# Patient Record
Sex: Male | Born: 1950
Health system: Southern US, Community
[De-identification: ages and names within clinical notes are randomized; demographics above are authoritative.]

## PROBLEM LIST (undated history)

## (undated) DIAGNOSIS — C6290 Malignant neoplasm of unspecified testis, unspecified whether descended or undescended: Secondary | ICD-10-CM

## (undated) DIAGNOSIS — N5235 Erectile dysfunction following radiation therapy: Secondary | ICD-10-CM

## (undated) DIAGNOSIS — N319 Neuromuscular dysfunction of bladder, unspecified: Secondary | ICD-10-CM

## (undated) DIAGNOSIS — K654 Sclerosing mesenteritis: Secondary | ICD-10-CM

## (undated) DIAGNOSIS — C801 Malignant (primary) neoplasm, unspecified: Secondary | ICD-10-CM

## (undated) DIAGNOSIS — K200 Eosinophilic esophagitis: Secondary | ICD-10-CM

## (undated) DIAGNOSIS — G723 Periodic paralysis: Secondary | ICD-10-CM

## (undated) DIAGNOSIS — C61 Malignant neoplasm of prostate: Secondary | ICD-10-CM

## (undated) DIAGNOSIS — G47419 Narcolepsy without cataplexy: Secondary | ICD-10-CM

## (undated) DIAGNOSIS — I10 Essential (primary) hypertension: Secondary | ICD-10-CM

## (undated) DIAGNOSIS — M87059 Idiopathic aseptic necrosis of unspecified femur: Secondary | ICD-10-CM

## (undated) DIAGNOSIS — H269 Unspecified cataract: Secondary | ICD-10-CM

## (undated) DIAGNOSIS — K529 Noninfective gastroenteritis and colitis, unspecified: Secondary | ICD-10-CM

## (undated) HISTORY — PX: HERNIA REPAIR: SHX51

## (undated) HISTORY — PX: TONSILLECTOMY: SUR1361

## (undated) HISTORY — PX: ORCHIECTOMY: SHX2116

## (undated) HISTORY — PX: LIPOMA EXCISION: SHX5283

## (undated) HISTORY — PX: RETINAL TEAR REPAIR CRYOTHERAPY: SHX5304

## (undated) HISTORY — DX: Sclerosing mesenteritis: K65.4

## (undated) HISTORY — PX: PROSTATECTOMY: SHX69

## (undated) HISTORY — PX: CATARACT EXTRACTION: SUR2

## (undated) HISTORY — PX: TOTAL HIP ARTHROPLASTY: SHX124

## (undated) HISTORY — DX: Malignant neoplasm of prostate: C61

## (undated) HISTORY — PX: ESOPHAGOGASTRODUODENOSCOPY: SHX1529

## (undated) HISTORY — DX: Idiopathic aseptic necrosis of unspecified femur: M87.059

## (undated) HISTORY — DX: Malignant neoplasm of unspecified testis, unspecified whether descended or undescended: C62.90

## (undated) HISTORY — PX: VASECTOMY: SHX75

## (undated) HISTORY — DX: Unspecified cataract: H26.9

## (undated) HISTORY — DX: Erectile dysfunction following radiation therapy: N52.35

---

## 2006-01-15 HISTORY — PX: PROSTATECTOMY: SHX69

## 2012-01-16 HISTORY — PX: COLONOSCOPY: SHX174

## 2012-01-16 HISTORY — PX: ORCHIECTOMY: SHX2116

## 2014-01-15 DIAGNOSIS — K200 Eosinophilic esophagitis: Secondary | ICD-10-CM

## 2014-01-15 HISTORY — DX: Eosinophilic esophagitis: K20.0

## 2014-01-15 HISTORY — PX: UPPER GASTROINTESTINAL ENDOSCOPY: SHX188

## 2016-01-16 HISTORY — PX: TOTAL HIP ARTHROPLASTY: SHX124

## 2017-01-15 HISTORY — PX: TOTAL HIP ARTHROPLASTY: SHX124

## 2019-02-03 DIAGNOSIS — G47411 Narcolepsy with cataplexy: Secondary | ICD-10-CM | POA: Diagnosis not present

## 2019-03-02 DIAGNOSIS — N319 Neuromuscular dysfunction of bladder, unspecified: Secondary | ICD-10-CM | POA: Diagnosis not present

## 2019-03-02 DIAGNOSIS — I10 Essential (primary) hypertension: Secondary | ICD-10-CM | POA: Diagnosis not present

## 2019-03-02 DIAGNOSIS — K219 Gastro-esophageal reflux disease without esophagitis: Secondary | ICD-10-CM | POA: Diagnosis not present

## 2019-03-02 DIAGNOSIS — G723 Periodic paralysis: Secondary | ICD-10-CM | POA: Diagnosis not present

## 2019-03-02 DIAGNOSIS — Z8546 Personal history of malignant neoplasm of prostate: Secondary | ICD-10-CM | POA: Diagnosis not present

## 2019-03-02 DIAGNOSIS — G459 Transient cerebral ischemic attack, unspecified: Secondary | ICD-10-CM | POA: Diagnosis not present

## 2019-03-02 DIAGNOSIS — E785 Hyperlipidemia, unspecified: Secondary | ICD-10-CM | POA: Diagnosis not present

## 2019-03-25 DIAGNOSIS — C61 Malignant neoplasm of prostate: Secondary | ICD-10-CM | POA: Diagnosis not present

## 2019-03-25 DIAGNOSIS — N319 Neuromuscular dysfunction of bladder, unspecified: Secondary | ICD-10-CM | POA: Diagnosis not present

## 2019-04-11 ENCOUNTER — Other Ambulatory Visit: Payer: Self-pay

## 2019-04-11 ENCOUNTER — Emergency Department (HOSPITAL_COMMUNITY)
Admission: EM | Admit: 2019-04-11 | Discharge: 2019-04-12 | Disposition: A | Discharge status: Home / Self Care | Payer: PPO | Attending: Emergency Medicine | Admitting: Emergency Medicine

## 2019-04-11 ENCOUNTER — Encounter (HOSPITAL_COMMUNITY): Payer: Self-pay | Admitting: Emergency Medicine

## 2019-04-11 DIAGNOSIS — Z8546 Personal history of malignant neoplasm of prostate: Secondary | ICD-10-CM | POA: Insufficient documentation

## 2019-04-11 DIAGNOSIS — I10 Essential (primary) hypertension: Secondary | ICD-10-CM | POA: Insufficient documentation

## 2019-04-11 DIAGNOSIS — Z79899 Other long term (current) drug therapy: Secondary | ICD-10-CM | POA: Insufficient documentation

## 2019-04-11 DIAGNOSIS — R1031 Right lower quadrant pain: Secondary | ICD-10-CM | POA: Diagnosis not present

## 2019-04-11 DIAGNOSIS — K5900 Constipation, unspecified: Secondary | ICD-10-CM | POA: Insufficient documentation

## 2019-04-11 HISTORY — DX: Narcolepsy without cataplexy: G47.419

## 2019-04-11 HISTORY — DX: Periodic paralysis: G72.3

## 2019-04-11 HISTORY — DX: Malignant (primary) neoplasm, unspecified: C80.1

## 2019-04-11 HISTORY — DX: Neuromuscular dysfunction of bladder, unspecified: N31.9

## 2019-04-11 HISTORY — DX: Noninfective gastroenteritis and colitis, unspecified: K52.9

## 2019-04-11 HISTORY — DX: Essential (primary) hypertension: I10

## 2019-04-11 HISTORY — DX: Eosinophilic esophagitis: K20.0

## 2019-04-11 LAB — URINALYSIS, ROUTINE W REFLEX MICROSCOPIC
Bilirubin Urine: NEGATIVE
Glucose, UA: NEGATIVE mg/dL
Hgb urine dipstick: NEGATIVE
Ketones, ur: NEGATIVE mg/dL
Leukocytes,Ua: NEGATIVE
Nitrite: NEGATIVE
Protein, ur: NEGATIVE mg/dL
Specific Gravity, Urine: 1.012 (ref 1.005–1.030)
pH: 5 (ref 5.0–8.0)

## 2019-04-11 LAB — COMPREHENSIVE METABOLIC PANEL
ALT: 29 U/L (ref 0–44)
AST: 21 U/L (ref 15–41)
Albumin: 4.6 g/dL (ref 3.5–5.0)
Alkaline Phosphatase: 86 U/L (ref 38–126)
Anion gap: 8 (ref 5–15)
BUN: 14 mg/dL (ref 8–23)
CO2: 22 mmol/L (ref 22–32)
Calcium: 9.3 mg/dL (ref 8.9–10.3)
Chloride: 110 mmol/L (ref 98–111)
Creatinine, Ser: 1.11 mg/dL (ref 0.61–1.24)
GFR calc Af Amer: >60 mL/min (ref >60)
GFR calc non Af Amer: >60 mL/min (ref >60)
Glucose, Bld: 104 mg/dL — ABNORMAL HIGH (ref 70–99)
Potassium: 4.1 mmol/L (ref 3.5–5.1)
Sodium: 140 mmol/L (ref 135–145)
Total Bilirubin: 0.8 mg/dL (ref 0.3–1.2)
Total Protein: 7.2 g/dL (ref 6.5–8.1)

## 2019-04-11 LAB — CBC
HCT: 45.6 % (ref 39.0–52.0)
Hemoglobin: 15.1 g/dL (ref 13.0–17.0)
MCH: 31 pg (ref 26.0–34.0)
MCHC: 33.1 g/dL (ref 30.0–36.0)
MCV: 93.6 fL (ref 80.0–100.0)
Platelets: 246 10*3/uL (ref 150–400)
RBC: 4.87 MIL/uL (ref 4.22–5.81)
RDW: 12.3 % (ref 11.5–15.5)
WBC: 7.1 10*3/uL (ref 4.0–10.5)
nRBC: 0 % (ref 0.0–0.2)

## 2019-04-11 LAB — LIPASE, BLOOD: Lipase: 53 U/L — ABNORMAL HIGH (ref 11–51)

## 2019-04-11 MED ORDER — SODIUM CHLORIDE 0.9 % IV SOLN: Freq: Once | INTRAVENOUS | Status: AC

## 2019-04-11 MED ORDER — SODIUM CHLORIDE 0.9% FLUSH: 3.0000 mL | Freq: Once | INTRAVENOUS | Status: DC

## 2019-04-11 NOTE — ED Triage Notes (Signed)
Pt c/o RLQ pain onset this afternoon, stabbing, +nausea, non radiating.

## 2019-04-11 NOTE — ED Provider Notes (Signed)
[facility] EMERGENCY DEPARTMENT Provider Note   CSN: [id number] Arrival date & time: 04/11/19  1957     History Chief Complaint  Patient presents with  . Abdominal Pain    Keith Walsh is a 69 y.o. male.  HPI Patient reports he got severe pain in his right lower quadrant this afternoon.  It started as a mild ache but then he got a severe episode that nearly doubled him over.  It did start to abate somewhat but he continued to have discomfort.  He became extremely nauseated when the episode occurred.  He has not seen any blood in the urine.  No pain or burning with urination.  No diarrhea or constipation.  No vomiting.  Patient has not had fever.  He reports off-and-on he has had a slight ache in the lower right quadrant but it has never gotten severe enough he felt he needed evaluation.  Pain did not radiate into the testicles or the leg.    Past Medical History:  Diagnosis Date  . Cancer [facility])    prostate  . Colitis   . Eosinophilic esophagitis   . Hyperkalemic periodic paralysis   . Hypertension   . Narcolepsy   . Neurogenic bladder disorder     There are no problems to display for this patient.   History reviewed. No pertinent surgical history.     No family history on file.  Social History   Tobacco Use  . Smoking status: Never Smoker  . Smokeless tobacco: Never Used  Substance Use Topics  . Alcohol use: Yes    Comment: occ  . Drug use: Never    Home Medications Prior to Admission medications   Medication Sig Start Date End Date Taking? Authorizing Provider  acetaZOLAMIDE (DIAMOX) 250 MG tablet Take 250 mg by mouth 4 (four) times daily. 02/16/19  Yes [provider]  amoxicillin (AMOXIL) 500 MG capsule Take 2,000 mg by mouth See admin instructions. Take 4 capsules (1000 mg) by mouth one hour prior to dental appointments   Yes [provider]  aspirin EC 81 MG tablet Take 81 mg by mouth daily with breakfast.   Yes  [provider]  losartan (COZAAR) 25 MG tablet Take 25 mg by mouth 2 (two) times daily with a meal. 02/16/19  Yes [provider]  MAGNESIUM PO Take 1 tablet by mouth at bedtime.   Yes [provider]  mirabegron ER (MYRBETRIQ) 25 MG TB24 tablet Take 25 mg by mouth daily with lunch.   Yes [provider]  modafinil (PROVIGIL) 200 MG tablet Take 200 mg by mouth 2 (two) times daily with breakfast and lunch.   Yes [provider]  pantoprazole (PROTONIX) 40 MG tablet Take 40 mg by mouth daily before breakfast. 02/16/19  Yes [provider]  Polyethyl Glycol-Propyl Glycol (SYSTANE ULTRA) 0.4-0.3 % SOLN Place 1 drop into both eyes 5 (five) times daily as needed (dry eyes).   Yes [provider]  Potassium 99 MG TABS Take 99 mg by mouth daily with breakfast.   Yes [provider]    Allergies    Proparacaine  Review of Systems   Review of Systems 10 Systems reviewed and are negative for acute change except as noted in the HPI.  Physical Exam Updated Vital Signs BP (!) 151/94   Pulse 69   Temp 98.2 F (36.8 C) (Oral)   Resp 14   SpO2 99%   Physical Exam Constitutional:  Appearance: Normal appearance.  HENT:     Head: Normocephalic and atraumatic.  Eyes:     Extraocular Movements: Extraocular movements intact.  Cardiovascular:     Rate and Rhythm: Normal rate and regular rhythm.     Pulses: Normal pulses.     Heart sounds: Normal heart sounds.  Pulmonary:     Effort: Pulmonary effort is normal.     Breath sounds: Normal breath sounds.  Abdominal:     Comments: Right lower quadrant tenderness no guarding.  Slight fullness in the inguinal region.  Upper abdomen nontender.  Genitourinary:    Comments: Bilateral testicles nontender.  No mass in the inguinal canal. Musculoskeletal:        General: No swelling or tenderness. Normal range of motion.     Right lower leg: No edema.     Left lower leg: No edema.   Skin:    General: Skin is warm and dry.  Neurological:     General: No focal deficit present.     Mental Status: He is alert and oriented to person, place, and time.     Coordination: Coordination normal.  Psychiatric:        Mood and Affect: Mood normal.     ED Results / Procedures / Treatments   Labs (all labs ordered are listed, but only abnormal results are displayed) Labs Reviewed  LIPASE, BLOOD - Abnormal; Notable for the following components:      Result Value   Lipase 53 (*)    All other components within normal limits  COMPREHENSIVE METABOLIC PANEL - Abnormal; Notable for the following components:   Glucose, Bld 104 (*)    All other components within normal limits  CBC  URINALYSIS, ROUTINE W REFLEX MICROSCOPIC    EKG None  Radiology No results found.  Procedures Procedures (including critical care time)  Medications Ordered in ED Medications  sodium chloride flush (NS) 0.9 % injection 3 mL (has no administration in time range)  0.9 %  sodium chloride infusion ( Intravenous New Bag/Given 04/11/19 2316)    ED Course  I have reviewed the triage vital signs and the nursing notes.  Pertinent labs & imaging results that were available during my care of the patient were reviewed by me and considered in my medical decision making (see chart for details).    MDM Rules/Calculators/A&P                      Patient at baseline is active and in good physical condition.  Intermittently he has been experience some right lower quadrant discomfort.  Today this became severe.  Pain includes the inguinal area.  On exam however there is no evident hernia.  We will proceed with CT scan to rule out appendicitis or hernia.  Anticipate if within normal limits, patient will be stable for discharge.  If no positive findings on CT, refer to general surgery for evaluation of suspected inguinal hernia. Final Clinical Impression(s) / ED Diagnoses Final diagnoses:  Right lower quadrant  abdominal pain    Rx / DC Orders ED Discharge Orders    None       Charlesetta Shanks, MD 04/11/19 2327

## 2019-04-12 ENCOUNTER — Emergency Department (HOSPITAL_COMMUNITY): Payer: PPO

## 2019-04-12 DIAGNOSIS — R1031 Right lower quadrant pain: Secondary | ICD-10-CM | POA: Diagnosis not present

## 2019-04-12 MED ORDER — IOHEXOL 300 MG/ML  SOLN
100.0000 mL | Freq: Once | INTRAMUSCULAR | Status: AC | PRN
  Administered 2019-04-12: 100 mL via INTRAVENOUS

## 2019-04-12 NOTE — Discharge Instructions (Signed)
Your CT scan showed no acute abnormality other than a large amount of stool in the right side of your large intestine.  This could be causing your pain.  You may alternate Tylenol 1000 mg every 6 hours as needed for pain and ibuprofen 600 mg every 8 hours as needed for pain.  I recommend that you increase your water and fiber intake. If you are not able to eat foods high in fiber, you may use Benefiber or Metamucil over-the-counter. I also recommend you use MiraLAX 1-2 times a day and Colace 100 mg twice a day to help with bowel movements. These medications are over the counter.  You may use other over-the-counter medications such as Dulcolax, Fleet enemas, magnesium citrate as needed for constipation. Please note that some of these medications may cause you to have abdominal cramping which is normal.   If you develop severe abdominal pain, fever (temperature of 100.4 or higher), persistent vomiting, distention of your abdomen, unable to have a bowel movement for 5 days or are not passing gas, please return to the hospital.

## 2019-04-12 NOTE — ED Provider Notes (Signed)
12:00 AM  Assumed care.  Patient is a 69 year old male who presented to the emergency department right lower quadrant abdominal pain.  Labs, urine unremarkable.  Plan is for CT of the abdomen pelvis for further evaluation.  1:05 AM  Pt's CT scan shows no acute abnormality.  Appendix appears normal.  He has diverticulosis without diverticulitis.  There is a moderate amount of stool in the right colon which could have caused his right-sided abdominal pain.  Will discharge with bowel regimen to help with constipation.  Recommended alternating Tylenol and ibuprofen for pain.  Recommended high-fiber diet.   At this time, I do not feel there is any life-threatening condition present. I have reviewed, interpreted and discussed all results (EKG, imaging, lab, urine as appropriate) and exam findings with patient/family. I have reviewed nursing notes and appropriate previous records.  I feel the patient is safe to be discharged home without further emergent workup and can continue workup as an outpatient as needed. Discussed usual and customary return precautions. Patient/family verbalize understanding and are comfortable with this plan.  Outpatient follow-up has been provided as needed. All questions have been answered.    Annie Roseboom, Delice Bison, DO 04/12/19 0109

## 2019-05-06 DIAGNOSIS — R7309 Other abnormal glucose: Secondary | ICD-10-CM | POA: Diagnosis not present

## 2019-05-06 DIAGNOSIS — N319 Neuromuscular dysfunction of bladder, unspecified: Secondary | ICD-10-CM | POA: Diagnosis not present

## 2019-05-06 DIAGNOSIS — I10 Essential (primary) hypertension: Secondary | ICD-10-CM | POA: Diagnosis not present

## 2019-05-06 DIAGNOSIS — G723 Periodic paralysis: Secondary | ICD-10-CM | POA: Diagnosis not present

## 2019-05-06 DIAGNOSIS — E785 Hyperlipidemia, unspecified: Secondary | ICD-10-CM | POA: Diagnosis not present

## 2019-05-06 DIAGNOSIS — G459 Transient cerebral ischemic attack, unspecified: Secondary | ICD-10-CM | POA: Diagnosis not present

## 2019-05-06 DIAGNOSIS — Z0000 Encounter for general adult medical examination without abnormal findings: Secondary | ICD-10-CM | POA: Diagnosis not present

## 2019-05-06 DIAGNOSIS — Z8546 Personal history of malignant neoplasm of prostate: Secondary | ICD-10-CM | POA: Diagnosis not present

## 2019-05-06 DIAGNOSIS — I700 Atherosclerosis of aorta: Secondary | ICD-10-CM | POA: Diagnosis not present

## 2019-05-06 DIAGNOSIS — I8393 Asymptomatic varicose veins of bilateral lower extremities: Secondary | ICD-10-CM | POA: Diagnosis not present

## 2019-05-06 DIAGNOSIS — Z1389 Encounter for screening for other disorder: Secondary | ICD-10-CM | POA: Diagnosis not present

## 2019-05-06 DIAGNOSIS — G47411 Narcolepsy with cataplexy: Secondary | ICD-10-CM | POA: Diagnosis not present

## 2019-05-26 ENCOUNTER — Other Ambulatory Visit: Payer: Self-pay | Admitting: *Deleted

## 2019-05-26 DIAGNOSIS — M79604 Pain in right leg: Secondary | ICD-10-CM

## 2019-05-26 DIAGNOSIS — M79605 Pain in left leg: Secondary | ICD-10-CM

## 2019-05-27 ENCOUNTER — Telehealth (HOSPITAL_COMMUNITY): Payer: Self-pay

## 2019-05-27 NOTE — Telephone Encounter (Signed)

## 2019-05-28 ENCOUNTER — Ambulatory Visit: Payer: PPO | Admitting: Physician Assistant

## 2019-05-28 ENCOUNTER — Other Ambulatory Visit: Payer: Self-pay

## 2019-05-28 ENCOUNTER — Encounter: Payer: Self-pay | Admitting: Vascular Surgery

## 2019-05-28 ENCOUNTER — Ambulatory Visit (HOSPITAL_COMMUNITY)
Admission: RE | Admit: 2019-05-28 | Discharge: 2019-05-28 | Disposition: A | Discharge status: Home / Self Care | Payer: PPO | Source: Ambulatory Visit | Attending: Surgery | Admitting: Surgery

## 2019-05-28 VITALS — BP 111/66 | HR 79 | Temp 97.9°F | Resp 18 | Ht 72.0 in | Wt 196.0 lb

## 2019-05-28 DIAGNOSIS — M79604 Pain in right leg: Secondary | ICD-10-CM

## 2019-05-28 DIAGNOSIS — M79605 Pain in left leg: Secondary | ICD-10-CM | POA: Diagnosis not present

## 2019-05-28 DIAGNOSIS — I872 Venous insufficiency (chronic) (peripheral): Secondary | ICD-10-CM

## 2019-05-28 NOTE — Progress Notes (Signed)
[facility] [place]   Reason for referral: Swollen left leg  History of Present Illness  Keith Walsh is a 69 y.o. male who presents with chief complaint: Left swelling with pain and buring leg.  Patient notes, onset of swelling > 5 years ago, associated with prolonged siting and standing.  The patient has had no history of DVT, positive history of varicose vein, no history of venous stasis ulcers, no history of  Lymphedema and positive history of skin changes in lower legs with erythema,and weeping.  There is a family history of venous disorders.  The patient has  used compression stockings in the past.  Past Medical History:  Diagnosis Date  . Cancer [facility])    prostate  . Colitis   . Eosinophilic esophagitis   . Hyperkalemic periodic paralysis   . Hypertension   . Narcolepsy   . Neurogenic bladder disorder     History reviewed. No pertinent surgical history.     Social History   Socioeconomic History  . Marital status: Widowed    Spouse name: Not on file  . Number of children: Not on file  . Years of education: Not on file  . Highest education level: Not on file  Occupational History  . Not on file  Tobacco Use  . Smoking status: Never Smoker  . Smokeless tobacco: Never Used  Substance and Sexual Activity  . Alcohol use: Yes    Comment: occ  . Drug use: Never  . Sexual activity: Not on file  Other Topics Concern  . Not on file  Social History Narrative  . Not on file   Social Determinants of Health   Financial Resource Strain:   . Difficulty of Paying Living Expenses:   Food Insecurity:   . Worried About Charity fundraiser in the Last Year:   . Arboriculturist in the Last Year:   Transportation Needs:   . Film/video editor (Medical):   Marland Kitchen Lack of Transportation (Non-Medical):   Physical Activity:   . Days of Exercise per Week:   . Minutes of Exercise per Session:   Stress:   . Feeling of Stress :   Social Connections:   .  Frequency of Communication with Friends and Family:   . Frequency of Social Gatherings with Friends and Family:   . Attends Religious Services:   . Active Member of Clubs or Organizations:   . Attends Archivist Meetings:   Marland Kitchen Marital Status:   Intimate Partner Violence:   . Fear of Current or Ex-Partner:   . Emotionally Abused:   Marland Kitchen Physically Abused:   . Sexually Abused:     Family History  Problem Relation Age of Onset  . Pernicious anemia Mother   . Cancer Mother   . Arthritis/Rheumatoid Mother   . Varicose Veins Father   . Heart disease Father   . Cancer Father     Current Outpatient Medications on File Prior to Visit  Medication Sig Dispense Refill  . acetaZOLAMIDE (DIAMOX) 250 MG tablet Take 250 mg by mouth 4 (four) times daily.    Marland Kitchen amoxicillin (AMOXIL) 500 MG capsule Take 2,000 mg by mouth See admin instructions. Take 4 capsules (1000 mg) by mouth one hour prior to dental appointments    . aspirin EC 81 MG tablet Take 81 mg by mouth daily with breakfast.    . losartan (COZAAR) 25 MG tablet Take 25 mg by mouth 2 (two) times daily  with a meal.    . MAGNESIUM PO Take 1 tablet by mouth at bedtime.    . mirabegron ER (MYRBETRIQ) 25 MG TB24 tablet Take 25 mg by mouth daily with lunch.    . modafinil (PROVIGIL) 200 MG tablet Take 200 mg by mouth 2 (two) times daily with breakfast and lunch.    . pantoprazole (PROTONIX) 40 MG tablet Take 40 mg by mouth daily before breakfast.    . Polyethyl Glycol-Propyl Glycol (SYSTANE ULTRA) 0.4-0.3 % SOLN Place 1 drop into both eyes 5 (five) times daily as needed (dry eyes).    . Potassium 99 MG TABS Take 99 mg by mouth daily with breakfast.     No current facility-administered medications on file prior to visit.    Allergies as of 05/28/2019 - Review Complete 04/11/2019  Allergen Reaction Noted  . Proparacaine Other (See Comments) 04/11/2019     ROS:   General:  No weight loss, Fever, chills  HEENT: No recent headaches,  no nasal bleeding, no visual changes, no sore throat  Neurologic: No dizziness, blackouts, seizures. No recent symptoms of stroke or mini- stroke. No recent episodes of slurred speech, or temporary blindness. [x]  history of MS type of neurologic dystrophy   Cardiac: No recent episodes of chest pain/pressure, no shortness of breath at rest.  No shortness of breath with exertion.  Denies history of atrial fibrillation or irregular heartbeat  Vascular: No history of rest pain in feet.  No history of claudication.  No history of non-healing ulcer, No history of DVT   Pulmonary: No home oxygen, no productive cough, no hemoptysis,  No asthma or wheezing  Musculoskeletal:  [ ]  Arthritis, [ ]  Low back pain,  [ ]  Joint pain  Hematologic:No history of hypercoagulable state.  No history of easy bleeding.  No history of anemia  Gastrointestinal: No hematochezia or melena,  No gastroesophageal reflux, no trouble swallowing  Urinary: [ ]  chronic Kidney disease, [ ]  on HD - [ ]  MWF or [ ]  TTHS, [ ]  Burning with urination, [ ]  Frequent urination, [ ]  Difficulty urinating;   Skin: No rashes  Psychological: No history of anxiety,  No history of depression  Physical Examination  Vitals:   05/28/19 1423  Resp: 18  Weight: 196 lb (88.9 kg)  Height: 6' (1.829 m)    Body mass index is 26.58 kg/m.  General:  Alert and oriented, no acute distress HEENT: Normal Neck: No bruit or JVD Pulmonary: Clear to auscultation bilaterally Cardiac: Regular Rate and Rhythm without murmur Abdomen: Soft, non-tender, non-distended, no mass, no scars Skin: No rash, prominent vericose vein medial and anterior left LE      Extremity Pulses:  2+ radial, brachial, femoral, dorsalis pedis, posterior tibial pulses bilaterally Musculoskeletal: No deformity or edema  Neurologic: Upper and lower extremity motor 5/5 and symmetric  DATA: +--------------+---------+------+-----------+------------+--------+  LEFT      Reflux NoRefluxReflux TimeDiameter cmsComments               Yes                   +--------------+---------+------+-----------+------------+--------+  CFV            yes  >1 second             +--------------+---------+------+-----------+------------+--------+  FV mid          yes  >1 second             +--------------+---------+------+-----------+------------+--------+  Popliteal  yes  >1 second             +--------------+---------+------+-----------+------------+--------+  GSV at [facility]        yes  >500 ms   0.910        +--------------+---------+------+-----------+------------+--------+  GSV prox thigh      yes  >500 ms   0.709        +--------------+---------+------+-----------+------------+--------+  GSV mid thigh       yes  >500 ms   0.582        +--------------+---------+------+-----------+------------+--------+  GSV dist thigh      yes  >500 ms   0.608        +--------------+---------+------+-----------+------------+--------+  GSV at knee        yes  >500 ms   0.578        +--------------+---------+------+-----------+------------+--------+  GSV prox calf       yes  >500 ms   1.17        +--------------+---------+------+-----------+------------+--------+  GSV mid calf       yes  >500 ms   0.635        +--------------+---------+------+-----------+------------+--------+  GSV dist calf       yes  >500 ms   0.604        +--------------+---------+------+-----------+------------+--------+  SSV Pop Fossa no               0.150        +--------------+---------+------+-----------+------------+--------+  SSV prox calf no               0.154          +--------------+---------+------+-----------+------------+--------+  SSV mid calf no               0.119        +--------------+---------+------+-----------+------------+--------+     Summary:  Left:  - No evidence of deep vein thrombosis seen in the left lower extremity,  from the common femoral through the popliteal veins.  - No evidence of superficial venous thrombosis in the left lower  extremity.  - No evidence of superficial venous reflux seen in the left short  saphenous vein.  - Venous reflux is noted in the left common femoral vein.  - Venous reflux is noted in the left sapheno-femoral junction.  - Venous reflux is noted in the left greater saphenous vein in the thigh.  - Venous reflux is noted in the left greater saphenous vein in the calf.  - Venous reflux is noted in the left femoral vein.  - Venous reflux is noted in the left popliteal vein.    Assessment: Left LE reflux with varicose veins - Venous reflux is noted in the left common femoral vein.  - Venous reflux is noted in the left sapheno-femoral junction.  - Venous reflux is noted in the left greater saphenous vein in the thigh.  - Venous reflux is noted in the left greater saphenous vein in the calf.  - Venous reflux is noted in the left femoral vein.  - Venous reflux is noted in the left popliteal vein.  Symptomatic history of cellulitis with weeping Left medial calf.   Plan: Thigh high 20-30 mmhg compression , elevation when at rest, water therapy and continue daily exercise as tolerates.  He will f/u in our vein clinic for exam and planing of possible intervention.     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    Terrence Dupont  Gerri Lins PA-C [facility] [place] Office: [phone]  MD in clinic [place]

## 2019-08-05 DIAGNOSIS — G4700 Insomnia, unspecified: Secondary | ICD-10-CM | POA: Diagnosis not present

## 2019-08-05 DIAGNOSIS — G47411 Narcolepsy with cataplexy: Secondary | ICD-10-CM | POA: Diagnosis not present

## 2019-09-02 DIAGNOSIS — H8110 Benign paroxysmal vertigo, unspecified ear: Secondary | ICD-10-CM | POA: Diagnosis not present

## 2019-10-16 DIAGNOSIS — N319 Neuromuscular dysfunction of bladder, unspecified: Secondary | ICD-10-CM | POA: Diagnosis not present

## 2019-10-16 DIAGNOSIS — C61 Malignant neoplasm of prostate: Secondary | ICD-10-CM | POA: Diagnosis not present

## 2019-10-23 DIAGNOSIS — R311 Benign essential microscopic hematuria: Secondary | ICD-10-CM | POA: Diagnosis not present

## 2019-10-23 DIAGNOSIS — N3040 Irradiation cystitis without hematuria: Secondary | ICD-10-CM | POA: Diagnosis not present

## 2019-10-23 DIAGNOSIS — N319 Neuromuscular dysfunction of bladder, unspecified: Secondary | ICD-10-CM | POA: Diagnosis not present

## 2019-10-27 DIAGNOSIS — N3000 Acute cystitis without hematuria: Secondary | ICD-10-CM | POA: Diagnosis not present

## 2019-11-04 DIAGNOSIS — G47411 Narcolepsy with cataplexy: Secondary | ICD-10-CM | POA: Diagnosis not present

## 2019-11-04 DIAGNOSIS — G4700 Insomnia, unspecified: Secondary | ICD-10-CM | POA: Diagnosis not present

## 2019-11-11 DIAGNOSIS — I700 Atherosclerosis of aorta: Secondary | ICD-10-CM | POA: Diagnosis not present

## 2019-11-11 DIAGNOSIS — I10 Essential (primary) hypertension: Secondary | ICD-10-CM | POA: Diagnosis not present

## 2019-11-11 DIAGNOSIS — G723 Periodic paralysis: Secondary | ICD-10-CM | POA: Diagnosis not present

## 2019-11-11 DIAGNOSIS — G459 Transient cerebral ischemic attack, unspecified: Secondary | ICD-10-CM | POA: Diagnosis not present

## 2019-11-11 DIAGNOSIS — G47411 Narcolepsy with cataplexy: Secondary | ICD-10-CM | POA: Diagnosis not present

## 2019-11-11 DIAGNOSIS — Z8546 Personal history of malignant neoplasm of prostate: Secondary | ICD-10-CM | POA: Diagnosis not present

## 2019-11-11 DIAGNOSIS — N319 Neuromuscular dysfunction of bladder, unspecified: Secondary | ICD-10-CM | POA: Diagnosis not present

## 2019-11-11 DIAGNOSIS — K219 Gastro-esophageal reflux disease without esophagitis: Secondary | ICD-10-CM | POA: Diagnosis not present

## 2019-11-26 ENCOUNTER — Encounter: Payer: Self-pay | Admitting: Vascular Surgery

## 2019-11-26 ENCOUNTER — Ambulatory Visit: Payer: PPO | Admitting: Vascular Surgery

## 2019-11-26 ENCOUNTER — Other Ambulatory Visit: Payer: Self-pay

## 2019-11-26 VITALS — BP 109/66 | HR 70 | Temp 97.7°F | Resp 18 | Ht 73.0 in | Wt 203.6 lb

## 2019-11-26 DIAGNOSIS — I83812 Varicose veins of left lower extremities with pain: Secondary | ICD-10-CM | POA: Diagnosis not present

## 2019-11-26 DIAGNOSIS — I872 Venous insufficiency (chronic) (peripheral): Secondary | ICD-10-CM | POA: Diagnosis not present

## 2019-11-26 NOTE — Progress Notes (Signed)
REASON FOR VISIT:   Follow-up of chronic venous insufficiency.  MEDICAL ISSUES:   CHRONIC VENOUS INSUFFICIENCY: This patient has CEAP C2 venous disease.  He has symptomatic varicose veins and has failed conservative treatment including thigh-high compression stockings with a gradient of 20 to 30 mmHg, leg elevation, and exercise.  I think he would be a good candidate for endovenous laser ablation of the left great saphenous vein with 10-20 stab phlebectomies.  However given his history of  hyperkalemic periodic paralysis with paramyotonia congenita I think we would be unable to perform tumescent anesthesia which would really be required for both stab phlebectomies and laser ablation.  I have had a long discussion with the patient about these risks.  I think the risk associated with with the procedure here in the office would be prohibitively high given his underlying condition and therefore I would favor conservative measures for the treatment of his venous disease.  I have explained that I will discuss this with anesthesia: See if they have any input on this.  I did discuss conservative measures.  We discussed the importance of intermittent leg elevation and the proper positioning for this.  In addition he will continue to wear his compression stockings.  I have encouraged him to avoid prolonged sitting and standing.  We discussed the importance of exercise specifically walking and water aerobics.  I will see him back as needed.   HPI:   TANYA MARVIN is a pleasant 69 y.o. male who was seen by Gerri Lins, PA on 05/28/2019 with left leg swelling.  The patient noted the development of left leg swelling over 5 years ago.  This is aggravated by sitting and standing.  The patient denied any previous history of DVT.  He was prescribed thigh-high compression stockings with a gradient of 20 to 30 mmHg, encouraged to elevate his legs, and encouraged to exercise.  He was set up for a 57-month  follow-up visit.  On my history, the patient has significant aching pain and heaviness in the left leg which is aggravated by standing and sitting and relieved with elevation.  He has been wearing his compression stockings which do help his symptoms some.  He also has been elevating his legs which helps.  He has had no previous history of DVT and no previous venous procedures.   He does occasionally experience some swelling in the left leg also.  He tells me that he has a condition called  He reportedly can experience paralysis with administration of even small amounts of anesthesia and it is not clear exactly what anesthetic agents could potentially cause this.    Past Medical History:  Diagnosis Date  . Cancer [facility])    prostate  . Colitis   . Eosinophilic esophagitis   . Hyperkalemic periodic paralysis   . Hypertension   . Narcolepsy   . Neurogenic bladder disorder     Family History  Problem Relation Age of Onset  . Pernicious anemia Mother   . Cancer Mother   . Arthritis/Rheumatoid Mother   . Varicose Veins Father   . Heart disease Father   . Cancer Father     SOCIAL HISTORY: Social History   Tobacco Use  . Smoking status: Never Smoker  . Smokeless tobacco: Never Used  Substance Use Topics  . Alcohol use: Yes    Comment: occ    Allergies  Allergen Reactions  . Proparacaine Other (See Comments)    Caused temporary paralysis (hyperkalemic periodic paralysis  with paramyotonia congenita)     Current Outpatient Medications  Medication Sig Dispense Refill  . acetaZOLAMIDE (DIAMOX) 250 MG tablet Take 250 mg by mouth 4 (four) times daily.    Marland Kitchen amoxicillin (AMOXIL) 500 MG capsule Take 2,000 mg by mouth See admin instructions. Take 4 capsules (1000 mg) by mouth one hour prior to dental appointments    . aspirin EC 81 MG tablet Take 81 mg by mouth daily with breakfast.    . losartan (COZAAR) 25 MG tablet Take 25 mg by mouth 2 (two) times daily with a meal.    .  MAGNESIUM PO Take 1 tablet by mouth at bedtime.    . pantoprazole (PROTONIX) 40 MG tablet Take 40 mg by mouth daily before breakfast.    . Polyethyl Glycol-Propyl Glycol (SYSTANE ULTRA) 0.4-0.3 % SOLN Place 1 drop into both eyes 5 (five) times daily as needed (dry eyes).    . Potassium 99 MG TABS Take 99 mg by mouth daily with breakfast.    . mirabegron ER (MYRBETRIQ) 25 MG TB24 tablet Take 25 mg by mouth daily with lunch.    . modafinil (PROVIGIL) 200 MG tablet Take 200 mg by mouth 2 (two) times daily with breakfast and lunch.     No current facility-administered medications for this visit.    REVIEW OF SYSTEMS:  [X]  denotes positive finding, [ ]  denotes negative finding Cardiac  Comments:  Chest pain or chest pressure:    Shortness of breath upon exertion:    Short of breath when lying flat:    Irregular heart rhythm:        Vascular    Pain in calf, thigh, or hip brought on by ambulation:    Pain in feet at night that wakes you up from your sleep:     Blood clot in your veins:    Leg swelling:         Pulmonary    Oxygen at home:    Productive cough:     Wheezing:         Neurologic    Sudden weakness in arms or legs:     Sudden numbness in arms or legs:     Sudden onset of difficulty speaking or slurred speech:    Temporary loss of vision in one eye:     Problems with dizziness:         Gastrointestinal    Blood in stool:     Vomited blood:         Genitourinary    Burning when urinating:     Blood in urine:        Psychiatric    Major depression:         Hematologic    Bleeding problems:    Problems with blood clotting too easily:        Skin    Rashes or ulcers:        Constitutional    Fever or chills:     PHYSICAL EXAM:   Vitals:   11/26/19 1525  BP: 109/66  Pulse: 70  Resp: 18  Temp: 97.7 F (36.5 C)  TempSrc: Temporal  SpO2: 95%  Weight: 203 lb 9.6 oz (92.4 kg)  Height: 6\' 1"  (1.854 m)    GENERAL: The patient is a well-nourished male,  in no acute distress. The vital signs are documented above. CARDIAC: There is a regular rate and rhythm.  VASCULAR: I do not detect carotid bruits. He has palpable pedal  pulses. He has significantly dilated veins of the left leg which are under significant pressure as documented in the photograph below.   I looked at his left great saphenous vein myself with the SonoSite and he has significant reflux throughout down to the calf.  There is a large dilated segment in the proximal calf below that the vein becomes quite anterior overlying the tibia. PULMONARY: There is good air exchange bilaterally without wheezing or rales. ABDOMEN: Soft and non-tender with normal pitched bowel sounds.  MUSCULOSKELETAL: There are no major deformities or cyanosis. NEUROLOGIC: No focal weakness or paresthesias are detected. SKIN: There are no ulcers or rashes noted. PSYCHIATRIC: The patient has a normal affect.  DATA:    VENOUS DUPLEX: I have reviewed the venous duplex scan that was done on 06/02/2019.  This was of the left lower extremity only.  There was no evidence of DVT or superficial venous thrombosis.  There was deep venous reflux involving the common femoral vein, femoral vein, and popliteal vein.  There was significant superficial venous reflux involving the left great saphenous vein from the saphenofemoral junction to the distal calf.  Diameters ranged from 0.58-1.2 cm.  A total of 45 minutes was spent on this visit. 25 minutes was face to face time. More than 50% of the time was spent on counseling and coordinating with the patient.   Deitra Mayo [facility] [phone]

## 2020-04-19 DIAGNOSIS — Z20822 Contact with and (suspected) exposure to covid-19: Secondary | ICD-10-CM | POA: Diagnosis not present

## 2020-04-27 ENCOUNTER — Encounter: Payer: Self-pay | Admitting: Internal Medicine

## 2020-04-27 DIAGNOSIS — C61 Malignant neoplasm of prostate: Secondary | ICD-10-CM | POA: Diagnosis not present

## 2020-05-20 DIAGNOSIS — R6882 Decreased libido: Secondary | ICD-10-CM | POA: Diagnosis not present

## 2020-06-06 DIAGNOSIS — I10 Essential (primary) hypertension: Secondary | ICD-10-CM | POA: Diagnosis not present

## 2020-06-06 DIAGNOSIS — G723 Periodic paralysis: Secondary | ICD-10-CM | POA: Diagnosis not present

## 2020-06-06 DIAGNOSIS — Z1389 Encounter for screening for other disorder: Secondary | ICD-10-CM | POA: Diagnosis not present

## 2020-06-06 DIAGNOSIS — G47411 Narcolepsy with cataplexy: Secondary | ICD-10-CM | POA: Diagnosis not present

## 2020-06-06 DIAGNOSIS — N319 Neuromuscular dysfunction of bladder, unspecified: Secondary | ICD-10-CM | POA: Diagnosis not present

## 2020-06-06 DIAGNOSIS — E349 Endocrine disorder, unspecified: Secondary | ICD-10-CM | POA: Diagnosis not present

## 2020-06-06 DIAGNOSIS — I700 Atherosclerosis of aorta: Secondary | ICD-10-CM | POA: Diagnosis not present

## 2020-06-06 DIAGNOSIS — R159 Full incontinence of feces: Secondary | ICD-10-CM | POA: Diagnosis not present

## 2020-06-06 DIAGNOSIS — K219 Gastro-esophageal reflux disease without esophagitis: Secondary | ICD-10-CM | POA: Diagnosis not present

## 2020-06-06 DIAGNOSIS — G459 Transient cerebral ischemic attack, unspecified: Secondary | ICD-10-CM | POA: Diagnosis not present

## 2020-06-06 DIAGNOSIS — Z8546 Personal history of malignant neoplasm of prostate: Secondary | ICD-10-CM | POA: Diagnosis not present

## 2020-06-06 DIAGNOSIS — Z0000 Encounter for general adult medical examination without abnormal findings: Secondary | ICD-10-CM | POA: Diagnosis not present

## 2020-07-01 ENCOUNTER — Encounter: Payer: Self-pay | Admitting: Internal Medicine

## 2020-07-01 ENCOUNTER — Ambulatory Visit: Payer: PPO | Admitting: Internal Medicine

## 2020-07-01 ENCOUNTER — Other Ambulatory Visit: Payer: Self-pay

## 2020-07-01 VITALS — BP 110/66 | HR 83 | Ht 73.0 in | Wt 203.2 lb

## 2020-07-01 DIAGNOSIS — K529 Noninfective gastroenteritis and colitis, unspecified: Secondary | ICD-10-CM

## 2020-07-01 DIAGNOSIS — R1032 Left lower quadrant pain: Secondary | ICD-10-CM

## 2020-07-01 DIAGNOSIS — K520 Gastroenteritis and colitis due to radiation: Secondary | ICD-10-CM

## 2020-07-01 DIAGNOSIS — K654 Sclerosing mesenteritis: Secondary | ICD-10-CM

## 2020-07-01 DIAGNOSIS — R152 Fecal urgency: Secondary | ICD-10-CM | POA: Diagnosis not present

## 2020-07-01 DIAGNOSIS — R194 Change in bowel habit: Secondary | ICD-10-CM

## 2020-07-01 DIAGNOSIS — G723 Periodic paralysis: Secondary | ICD-10-CM | POA: Diagnosis not present

## 2020-07-01 DIAGNOSIS — R159 Full incontinence of feces: Secondary | ICD-10-CM | POA: Diagnosis not present

## 2020-07-01 DIAGNOSIS — K200 Eosinophilic esophagitis: Secondary | ICD-10-CM | POA: Diagnosis not present

## 2020-07-01 MED ORDER — DICYCLOMINE HCL 20 MG PO TABS: 20.0000 mg | ORAL_TABLET | Freq: Three times a day (TID) | ORAL | 0 refills | Status: DC

## 2020-07-01 NOTE — Patient Instructions (Signed)
You have been scheduled for a colonoscopy. Please follow written instructions given to you at your visit today.  Please pick up your prep supplies at the pharmacy within the next 1-3 days. If you use inhalers (even only as needed), please bring them with you on the day of your procedure.  If you are age 68 or older, your body mass index should be between 23-30. Your Body mass index is 26.81 kg/m. If this is out of the aforementioned range listed, please consider follow up with your Primary Care Provider.  If you are age 1 or younger, your body mass index should be between 19-25. Your Body mass index is 26.81 kg/m. If this is out of the aformentioned range listed, please consider follow up with your Primary Care Provider.   __________________________________________________________  The [place] [facility] providers would like to encourage you to use [facility] to communicate with providers for non-urgent requests or questions.  Due to long hold times on the telephone, sending your provider a message by [facility] may be a faster and more efficient way to get a response.  Please allow 48 business hours for a response.  Please remember that this is for non-urgent requests.    We have sent the following medications to your pharmacy for you to pick up at your convenience: Dicyclomine   I appreciate the opportunity to care for you. Silvano Rusk, MD, [facility]

## 2020-07-01 NOTE — Progress Notes (Signed)
Hypokalemic periodic       Keith Walsh 70 y.o. 27-Nov-1950 [id number]  Assessment & Plan:   Encounter Diagnoses  Name Primary?   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 Use pelvic PT pending w/u  Meds ordered this encounter  Medications   dicyclomine (BENTYL) 20 MG tablet    Sig: Take 1 tablet (20 mg total) by mouth 4 (four) times daily -  before meals and at bedtime. May take less frequently or 1/2 tablet    Dispense:  120 tablet    Refill:  0   .I appreciate the opportunity to care for him.  JJ:[id number], [place] Ar, MD Dutch Gray, MD   Subjective:   Chief Complaint: Intermittent abdominal pain and fecal incontinence  HPI This is a 70 year old white man with a fairly complicated past medical history that includes prostate cancer treated with surgery and subsequent radiation, testicular cancer, familial hypokalemic periodic paralysis, eosinophilic esophagitis, and a question of chronic colitis after radiation treatment who is here for evaluation, previous GI evaluations had taken place in [place].  He is an excellent historian and I also have access to some records through his MyChart.  Subsequently I was able to get into care everywhere.Marland Kitchen  He is complaining of generalized abdominal pain coming on since April, pantoprazole may be helping that some, he is having some urgent defecation and fecal incontinence at times.  He reports having had a colonoscopy in [place] about 3 to 4 years ago 2017-ish) and 2 polyps were removed that  were hyperplastic.  Was given a 10-year recall.  He had the prostate cancer resected and then required radiation therapy after that, in 2012.  In 2016-7 he was diagnosed with eosinophilic esophagitis as well and treated with pantoprazole and it sounds like he had a dilation.  Initially had swallowing problems that started when having dinner with friends and a person at the dinner also had a dysphagia episode in the patient wondered if it was not from red wine they were having.  He has not had a follow-up endoscopy.  He is not really having dysphagia now.  At some point along the way he was told he had a colitis perhaps from the radiation though I do not think there were biopsies taken.  He was placed on pantoprazole or was on that for the EOE and that seemed to mitigate some of his symptoms of diarrhea etc.  He had an allergy evaluation I believe that did not show any particular food allergies.  He has not had any recent antibiotics, he has had no other particular changes in diet or anything like that.  He says he cannot trust whether it is gas versus stool.  No new medications though he notes that he has had odd reactions to medications given his familial hypokalemic periodic paralysis.  That is to say he may get side effects from medications not typical because of that.  It took a long time to figure out he had that disorder he relates.  He has urinary incontinence issues as well since he had his prostate interventions.  He has not seen pelvic floor physical therapy in the past.  He has begun to establish with Dr. Dutch Gray of [facility] to follow-up his prostate cancer.  I think he has been on some "irritable bladder drugs" in the past.  Since the accident the abdominal pain is vague and diffuse but mainly upper.  Follow-up MRI is a postprandial pain and cramp so he has reduced eating.  May be losing some weight he thinks but trenbolone does not support.   Records review 2018 GI subsequent to in  person   GI History ( + pertinent RADIOLOGY): - Sclerosing mesenteritis - Radiation proctitis - Eosinophilic esophagitis - Hyperkalemic periodic paralysis with paramytonia congenita (Na+ channel disorder per patient)  07/20/05 random biopsy colon, no colitis  07/11/2012 colonoscopy  Radiation proctitis  02/16/14 EGD Bx > 62 eos/HPF  02/18/14 CT Mild unchanged soft tissue stranding left inguinal subq tissues; may be post surgical  04/19/14 EGD for dysphagia Schatzki ring trachealization of the esophagus with linear furrows 71F maloney passed. No heme Bx esophagus: normal  04/11/15 colonoscopy for diarrhea and fecal incont. Pathology: terminal ileum, colon random biopsy normal Hyperplastic rectal polyp  06/15/16 CT abdomen/pelvis Mild fatty liver Mild sclerosing meseteritis  08/09/16 CT Improved mesenteric haziness Small fat containing umbilical hernia Mild atherosclerotic calcifications of aorta Avascular necrosis both hips     Wt Readings from Last 3 Encounters:  07/01/20 203 lb 3.2 oz (92.2 kg)  11/26/19 203 lb 9.6 oz (92.4 kg)  05/28/19 196 lb (88.9 kg)   Recent labs [facility]   RESULTS   RESULTS Name Result Date Reference Range  CBC without Diff   2020-06-06    WBC 4.7   4.0-11.0  RBC 4.48   4.20-5.80  HGB 14.0   13.0-17.0  HCT 41.5   39.0-52.0  MCV 92.7   80.0-94.0  MCH 31.4   27.0-33.0  MCHC 33.8   32.0-36.0  RDW 13.2   11.5-15.5  PLT 221   710-626  Comp Metabolic Panel   9485-46-27    Glucose 99   70-99  BUN 15   6-26  Creatinine 1.04   0.60-1.30  eGFR2021 78   >60  Sodium 141   136-145  Potassium 3.9   3.5-5.5  Chloride 112   98-107  CO2 27   22-32  Anion Gap 7.0   6.0-20.0  Calcium 9.4   8.6-10.3  CA-corrected 8.92   8.60-10.30  Protein, Total 6.7   6.0-8.3  Albumin 4.5   3.4-4.8  TBIL 0.6   0.3-1.0  ALP 72   38-126  AST 20   0-39  ALT 23   0-52  TSH   2020-06-06    TSH 1.24   0.34-4.50  Lipid Panel w/reflex   2020-06-06    Cholesterol 201    <200  CHOL/HDL 3.9   2.0-4.0  HDLD 52   30-70  Triglyceride 123   0-199  NHDL 149   0-129  LDL Chol Calc (NIH) 127   0-99  LDL Chol Calc (NIH) 127   0-99  Hemoglobin A1c   2019-05-06    eAG 111      Hgb A1c 5.5   0.3-5.0  Comp Metabolic Panel      Allergies  Allergen Reactions   Proparacaine Other (See Comments)    Caused temporary paralysis (hyperkalemic periodic paralysis with paramyotonia congenita)    Current Meds  Medication Sig  acetaZOLAMIDE (DIAMOX) 250 MG tablet Take 250 mg by mouth 4 (four) times daily.   amoxicillin (AMOXIL) 500 MG capsule Take 2,000 mg by mouth See admin instructions. Take 4 capsules (1000 mg) by mouth one hour prior to dental appointments   aspirin EC 81 MG tablet Take 81 mg by mouth daily with breakfast.   dicyclomine (BENTYL) 20 MG tablet Take 1 tablet (20 mg total) by mouth 4 (four) times daily -  before meals and at bedtime. May take less frequently or 1/2 tablet   losartan (COZAAR) 50 MG tablet Take 50 mg by mouth daily.   pantoprazole (PROTONIX) 40 MG tablet Take 40 mg by mouth daily before breakfast.   Polyethyl Glycol-Propyl Glycol (SYSTANE ULTRA) 0.4-0.3 % SOLN Place 1 drop into both eyes 5 (five) times daily as needed (dry eyes).   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  He moved to [place] to be near his son and grandchildren and he has a daughter in [place] as well.      Drinks 1 coffee and 2 teas a day.  No drug use no tobacco never smoker 2 to 3 glasses of wine per month.   family history includes Arthritis/Rheumatoid in his mother; Cancer in his mother; Colon cancer in his maternal grandmother; Heart disease in his father; Pancreatic cancer in his father; Pernicious anemia in his mother; Varicose Veins in his father.   Review of Systems  As per HPI Otherwise neg Objective:   Physical Exam @BP  110/66   Pulse 83   Ht 6\' 1"  (1.854 m)   Wt 203 lb 3.2 oz (92.2 kg)   SpO2 96%   BMI 26.81 kg/m @  General:  Well-developed, well-nourished and in no acute distress Eyes:  anicteric. Lungs: Clear to auscultation bilaterally. Heart:  S1S2, no rubs, murmurs, gallops. Abdomen:  soft, mild-mod tender LLQ, no hepatosplenomegaly, hernia, or mass and BS+.  Rectal:  Small ant tag O/w anoderm ok Nontender no mass prostate absent Resting tne dec NL vol and sim def w/ sl inc descent  Anoscopy - telangiectasia Lymph:  no cervical or supraclavicular adenopathy. Extremities:   no edema, cyanosis or clubbing Skin   no rash. Neuro:  A&O x 3.  Psych:  appropriate mood and  Affect.   Data Reviewed:  See HPI

## 2020-07-06 ENCOUNTER — Encounter: Payer: Self-pay | Admitting: Internal Medicine

## 2020-07-06 DIAGNOSIS — K200 Eosinophilic esophagitis: Secondary | ICD-10-CM | POA: Insufficient documentation

## 2020-07-06 DIAGNOSIS — G723 Periodic paralysis: Secondary | ICD-10-CM | POA: Insufficient documentation

## 2020-07-06 DIAGNOSIS — K529 Noninfective gastroenteritis and colitis, unspecified: Secondary | ICD-10-CM | POA: Insufficient documentation

## 2020-07-06 DIAGNOSIS — K520 Gastroenteritis and colitis due to radiation: Secondary | ICD-10-CM | POA: Insufficient documentation

## 2020-07-06 DIAGNOSIS — K654 Sclerosing mesenteritis: Secondary | ICD-10-CM | POA: Insufficient documentation

## 2020-07-22 ENCOUNTER — Ambulatory Visit (AMBULATORY_SURGERY_CENTER): Payer: PPO | Admitting: Internal Medicine

## 2020-07-22 ENCOUNTER — Encounter: Payer: Self-pay | Admitting: Internal Medicine

## 2020-07-22 ENCOUNTER — Other Ambulatory Visit: Payer: Self-pay

## 2020-07-22 VITALS — BP 102/64 | HR 64 | Temp 97.1°F | Resp 14 | Ht 73.0 in | Wt 203.0 lb

## 2020-07-22 DIAGNOSIS — R194 Change in bowel habit: Secondary | ICD-10-CM

## 2020-07-22 DIAGNOSIS — K5520 Angiodysplasia of colon without hemorrhage: Secondary | ICD-10-CM | POA: Diagnosis not present

## 2020-07-22 DIAGNOSIS — K6289 Other specified diseases of anus and rectum: Secondary | ICD-10-CM | POA: Diagnosis not present

## 2020-07-22 DIAGNOSIS — K5730 Diverticulosis of large intestine without perforation or abscess without bleeding: Secondary | ICD-10-CM

## 2020-07-22 MED ORDER — SODIUM CHLORIDE 0.9 % IV SOLN: 500.0000 mL | Freq: Once | INTRAVENOUS | Status: DC

## 2020-07-22 NOTE — Op Note (Addendum)
[place] Patient Name: Keith Walsh Procedure Date: 07/22/2020 7:33 AM MRN: [id number] Endoscopist: Gatha Mayer , MD Age: 70 Referring MD:  Date of Birth: 22-Jul-1950 Gender: Male Account #: [id number] Procedure:                Colonoscopy Indications:              Change in bowel habits Medicines:                Propofol per Anesthesia, Monitored Anesthesia Care Procedure:                Pre-Anesthesia Assessment:                           - Prior to the procedure, a History and Physical                            was performed, and patient medications and                            allergies were reviewed. The patient's tolerance of                            previous anesthesia was also reviewed. The risks                            and benefits of the procedure and the sedation                            options and risks were discussed with the patient.                            All questions were answered, and informed consent                            was obtained. Prior Anticoagulants: The patient has                            taken no previous anticoagulant or antiplatelet                            agents. ASA Grade Assessment: II - A patient with                            mild systemic disease. After reviewing the risks                            and benefits, the patient was deemed in                            satisfactory condition to undergo the procedure.                           After obtaining informed consent, the colonoscope  was passed under direct vision. Throughout the                            procedure, the patient's blood pressure, pulse, and                            oxygen saturations were monitored continuously. The                            Olympus CF-HQ190L [id number]) Colonoscope was                            introduced through the anus and advanced to the the                            terminal ileum,  with identification of the                            appendiceal orifice and IC valve. The colonoscopy                            was performed without difficulty. The patient                            tolerated the procedure well. The quality of the                            bowel preparation was excellent. Anatomical                            landmarks were photographed. The bowel preparation                            used was Miralax via split dose instruction. Scope In: 7:45:15 AM Scope Out: 7:57:37 AM Scope Withdrawal Time: 0 hours 7 minutes 56 seconds  Total Procedure Duration: 0 hours 12 minutes 22 seconds  Findings:                 The digital rectal exam findings include surgically                            absent prostate.                           The terminal ileum appeared normal.                           Multiple angioectasias without bleeding were found                            in the distal rectum. Biopsies were taken with a                            cold forceps for histology. Verification of patient  identification for the specimen was done. Estimated                            blood loss was minimal.                           Multiple diverticula were found in the sigmoid                            colon.                           The exam was otherwise without abnormality on                            direct and retroflexion views. Complications:            No immediate complications. Estimated Blood Loss:     Estimated blood loss was minimal. Impression:               - A surgically absent prostate found on digital                            rectal exam.                           - The examined portion of the ileum was normal.                           - Multiple non-bleeding colonic angioectasias.                            Biopsied.                           - Diverticulosis in the sigmoid colon.                           - The  examination was otherwise normal on direct                            and retroflexion views.                           . Recommendation:           - Patient has a contact number available for                            emergencies. The signs and symptoms of potential                            delayed complications were discussed with the                            patient. Return to normal activities tomorrow.  Written discharge instructions were provided to the                            patient.                           - Resume previous diet.                           - Continue present medications.                           - Await pathology results.                           - Repeat colonoscopy is recommended. The                            colonoscopy date will be determined after pathology                            results from today's exam become available for                            review.                           He reports dicyclomine very helpful for sxs and                            neurogenic bladder. Hx sclerosing mesenteritis -                            does not seem active, will think about CT pending                            clinical course Gatha Mayer, MD 07/22/2020 8:11:11 AM This report has been signed electronically.

## 2020-07-22 NOTE — Progress Notes (Signed)
PT taken to PACU. Monitors in place. VSS. Report given to RN. 

## 2020-07-22 NOTE — Progress Notes (Signed)
Medical history reviewed with no changes noted. VS assessed by C.W 

## 2020-07-22 NOTE — Progress Notes (Signed)
Called to room to assist during endoscopic procedure.  Patient ID and intended procedure confirmed with present staff. Received instructions for my participation in the procedure from the performing physician.  

## 2020-07-22 NOTE — Patient Instructions (Addendum)
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 This means that there is a proliferation of dilated blood vessels related to prior radiation. Does not usually cause diarrhea but may cause bleeding.  All else looks great.  I took biopsies to look for microscopic changes that could give other information.  I also found out through a linked medical record that you had sclerosing mesenteritis in past.  We may need to repeat a CT scan - will let you know after pathology results in.  You may see a bit of bleeding from biopsies but that should subside by tomorrow.  I appreciate the opportunity to care for you. Gatha Mayer, MD, [facility]  Handout given for diverticulosis.   YOU HAD AN ENDOSCOPIC PROCEDURE TODAY AT [facility]:   Refer to the procedure report that was given to you for any specific questions about what was found during the examination.  If the procedure report does not answer your questions, please call your gastroenterologist to clarify.  If you requested that your care partner not be given the details of your procedure findings, then the procedure report has been included in a sealed envelope for you to review at your convenience later.  YOU SHOULD EXPECT: Some feelings of bloating in the abdomen. Passage of more gas than usual.  Walking can help get rid of the air that was put into your GI tract during the procedure and reduce the bloating. If you had a lower endoscopy (such as a colonoscopy or flexible sigmoidoscopy) you may notice spotting of blood in your stool or on the toilet paper. If you underwent a bowel prep for your procedure, you may not have a normal bowel movement for a few days.  Please Note:  You might notice some irritation and congestion in your nose or some drainage.  This is from the oxygen used during your procedure.  There is no need for concern and it should clear up in a day or so.  SYMPTOMS TO REPORT  IMMEDIATELY:  Following lower endoscopy (colonoscopy or flexible sigmoidoscopy):  Excessive amounts of blood in the stool  Significant tenderness or worsening of abdominal pains  Swelling of the abdomen that is new, acute  Fever of 100F or higher  For urgent or emergent issues, a gastroenterologist can be reached at any hour by calling [phone]. Do not use MyChart messaging for urgent concerns.    DIET:  We do recommend a small meal at first, but then you may proceed to your regular diet.  Drink plenty of fluids but you should avoid alcoholic beverages for 24 hours.  ACTIVITY:  You should plan to take it easy for the rest of today and you should NOT DRIVE or use heavy machinery until tomorrow (because of the sedation medicines used during the test).    FOLLOW UP: Our staff will call the number listed on your records 48-72 hours following your procedure to check on you and address any questions or concerns that you may have regarding the information given to you following your procedure. If we do not reach you, we will leave a message.  We will attempt to reach you two times.  During this call, we will ask if you have developed any symptoms of COVID 19. If you develop any symptoms (ie: fever, flu-like symptoms, shortness of breath, cough etc.) before then, please call [phone].  If you test positive for Covid 19 in the 2 weeks post  procedure, please call and report this information to [place].    If any biopsies were taken you will be contacted by phone or by letter within the next 1-3 weeks.  Please call us at [phone] if you have not heard about the biopsies in 3 weeks.    SIGNATURES/CONFIDENTIALITY: You and/or your care partner have signed paperwork which will be entered into your electronic medical record.  These signatures attest to the fact that that the information above on your After Visit Summary has been reviewed and is understood.  Full responsibility of the  confidentiality of this discharge information lies with you and/or your care-partner.

## 2020-07-26 ENCOUNTER — Telehealth: Payer: Self-pay

## 2020-07-26 NOTE — Telephone Encounter (Signed)
  Follow up Call-  Call back number 07/22/2020  Post procedure Call Back phone  # [phone]  Permission to leave phone message Yes     Patient questions:  Do you have a fever, pain , or abdominal swelling? No. Pain Score  0 *  Have you tolerated food without any problems?  yes Have you been able to return to your normal activities? Yes.    Do you have any questions about your discharge instructions: Diet   No. Medications  No. Follow up visit  No.  Do you have questions or concerns about your Care? No.  Actions: * If pain score is 4 or above: No action needed, pain <4.

## 2020-08-05 ENCOUNTER — Other Ambulatory Visit: Payer: Self-pay | Admitting: Internal Medicine

## 2020-08-05 ENCOUNTER — Ambulatory Visit
Admission: RE | Admit: 2020-08-05 | Discharge: 2020-08-05 | Disposition: A | Discharge status: Home / Self Care | Payer: PPO | Source: Ambulatory Visit | Attending: Internal Medicine | Admitting: Internal Medicine

## 2020-08-05 DIAGNOSIS — M25532 Pain in left wrist: Secondary | ICD-10-CM | POA: Diagnosis not present

## 2020-08-15 ENCOUNTER — Other Ambulatory Visit: Payer: Self-pay | Admitting: Internal Medicine

## 2020-08-15 MED ORDER — DICYCLOMINE HCL 10 MG PO CAPS: 10.0000 mg | ORAL_CAPSULE | Freq: Three times a day (TID) | ORAL | 3 refills | Status: DC

## 2020-08-18 ENCOUNTER — Other Ambulatory Visit: Payer: Self-pay | Admitting: Internal Medicine

## 2020-08-18 MED ORDER — DICYCLOMINE HCL 10 MG PO CAPS: 10.0000 mg | ORAL_CAPSULE | Freq: Three times a day (TID) | ORAL | 3 refills | Status: DC

## 2020-08-23 ENCOUNTER — Telehealth: Payer: Self-pay

## 2020-08-23 MED ORDER — DICYCLOMINE HCL 10 MG PO CAPS: 10.0000 mg | ORAL_CAPSULE | Freq: Three times a day (TID) | ORAL | 3 refills | Status: AC

## 2020-08-23 NOTE — Telephone Encounter (Signed)
[facility] called and wanted to confirm the # of dicyclomine for 90 days. Said #270 and she said should be #360 for QID dosing. I gave them a verbal to change it.

## 2020-09-29 DIAGNOSIS — M5442 Lumbago with sciatica, left side: Secondary | ICD-10-CM | POA: Diagnosis not present

## 2020-09-29 DIAGNOSIS — M5441 Lumbago with sciatica, right side: Secondary | ICD-10-CM | POA: Diagnosis not present

## 2020-09-29 DIAGNOSIS — M545 Low back pain, unspecified: Secondary | ICD-10-CM | POA: Diagnosis not present

## 2020-10-13 DIAGNOSIS — M545 Low back pain, unspecified: Secondary | ICD-10-CM | POA: Diagnosis not present

## 2020-10-21 DIAGNOSIS — M545 Low back pain, unspecified: Secondary | ICD-10-CM | POA: Diagnosis not present

## 2020-10-28 ENCOUNTER — Encounter: Payer: Self-pay | Admitting: Internal Medicine

## 2020-10-28 ENCOUNTER — Ambulatory Visit (INDEPENDENT_AMBULATORY_CARE_PROVIDER_SITE_OTHER): Payer: PPO | Admitting: Internal Medicine

## 2020-10-28 VITALS — BP 132/62 | HR 72 | Ht 73.0 in | Wt 203.0 lb

## 2020-10-28 DIAGNOSIS — R159 Full incontinence of feces: Secondary | ICD-10-CM | POA: Diagnosis not present

## 2020-10-28 DIAGNOSIS — K200 Eosinophilic esophagitis: Secondary | ICD-10-CM

## 2020-10-28 DIAGNOSIS — R152 Fecal urgency: Secondary | ICD-10-CM

## 2020-10-28 DIAGNOSIS — N319 Neuromuscular dysfunction of bladder, unspecified: Secondary | ICD-10-CM | POA: Diagnosis not present

## 2020-10-28 DIAGNOSIS — G723 Periodic paralysis: Secondary | ICD-10-CM | POA: Diagnosis not present

## 2020-10-28 MED ORDER — PANTOPRAZOLE SODIUM 20 MG PO TBEC: 20.0000 mg | DELAYED_RELEASE_TABLET | Freq: Every day | ORAL | 0 refills | Status: AC

## 2020-10-28 NOTE — Patient Instructions (Signed)
Pantoprazole taper recommendations:  I have sent in a prescription for 20 mg pantoprazole.  For the next  week to take 40 mg 1 day 20 mg the next and then move to 20 mg daily for 1 week and then take that every other day.  After a week or 2 it every other day try to quit it.  You can use over-the-counter Pepcid or antacids like Tums or Gaviscon as needed to treat heartburn in between.  If you have a lot of reflux or swallowing problems after this is done we can figure out how to best treat that or what ever else might be needed.  Just let me know.  We have referred you to physical therapy for pelvic floor physical therapy.  I think that can help you a lot and I hope it does.   I appreciate the opportunity to care for you. Gatha Mayer, MD, [facility]

## 2020-10-28 NOTE — Progress Notes (Signed)
Keith Walsh 70 y.o. [birth year] [id number]  Assessment & Plan:   Encounter Diagnoses  Name Primary?   Incontinence of feces with fecal urgency Yes   Neurogenic bladder    Hypokalemic familial periodic paralysis    Eosinophilic esophagitis    Refer to physical therapy to see if that can help with neurogenic bladder and defecation issues.  Try to taper off PPI.  He does have a history of eosinophilic esophagitis and might require chronic therapy.  We reviewed a plan to taper.  Return to me pending results of the above.  Orders Placed This Encounter  Procedures   Ambulatory referral to Physical Therapy   Meds ordered this encounter  Medications   pantoprazole (PROTONIX) 20 MG tablet    Sig: Take 1 tablet (20 mg total) by mouth daily before breakfast. Using to taper off PPI    Dispense:  30 tablet    Refill:  0    CC: Wenda Low, MD  Subjective:   Chief Complaint: Fecal incontinence alternating bowel habits question stop PPI  HPI 70 year old white man with a history of eosinophilic esophagitis and previous radiation induced proctitis/telangiectasia and bowel habit changes and a history of sclerosing mesenteritis.  I met him in the summer where he had complaints of a change in bowel habits.  At that time he thought that being on pantoprazole had helped his bowel habits he does have a history of eosinophilic esophagitis and dysphagia and we believe that is really why he was maintained on PPI.  Today he wonders about coming off of that.  I did a colonoscopy in July with findings as follows:   - A surgically absent prostate found on digital rectal exam. - The examined portion of the ileum was normal. - Multiple non-bleeding colonic angioectasias. Biopsied. - Diverticulosis in the sigmoid colon. - The examination was otherwise normal on direct and retroflexion views.   Path results:  1. Surgical [P], colon, transverse and ascending - BENIGN COLONIC MUCOSA - NO ACTIVE  INFLAMMATION OR EVIDENCE OF MICROSCOPIC COLITIS - NO HIGH GRADE DYSPLASIA OR MALIGNANCY IDENTIFIED 2. Surgical [P], proximal rectum, sigmoid, and descending - BENIGN COLONIC MUCOSA - NO ACTIVE INFLAMMATION OR EVIDENCE OF MICROSCOPIC COLITIS - NO HIGH GRADE DYSPLASIA OR MALIGNANCY IDENTIFIED 3. Surgical [P], distal rectum - STROMAL HYALINIZATION WITH REACTIVE/DEGENERATIVE GLANDULAR CHANGES, COMPATIBLE WITH RADIATION-INDUCED   I have prescribed dicyclomine which does help with his defecation issues.  Not perfectly as it were.  He still has difficulty cleansing at times and he also has some fecal urgency and incontinence issues.  Not common but it is a problem and quite bothersome and disruptive to quality of life.  He has never been through pelvic floor physical therapy that I am aware of.  He characterizes his bowel movement issues as follows "I cannot go until my body says it is time and when it is time its now".  He believes he is on a high-fiber diet.  He has adequate fluid intake.  68 to 8 L a day 2 cups of tea and the rest water.  He notes that when his hyperkalemic periodic paralysis flares his neurogenic bladder disorder and his defecation issues are worse.  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   aspirin EC 81 MG tablet Take 81 mg by mouth daily with breakfast.   dicyclomine (BENTYL) 10 MG capsule Take 1 capsule (10 mg total) by mouth 4 (four) times daily -  before meals and at bedtime.   losartan (COZAAR) 50 MG tablet Take 50 mg by mouth daily.   pantoprazole (PROTONIX) 40 MG tablet Take 40 mg by mouth daily before breakfast.   Polyethyl Glycol-Propyl Glycol (SYSTANE ULTRA) 0.4-0.3 % SOLN Place 1 drop into both eyes 5 (five)  times daily as needed (dry eyes).   Past Medical History:  Diagnosis Date   Avascular necrosis of femoral head ([place])    Cataract    Colitis    Eosinophilic esophagitis 7943   Erectile dysfunction following radiation therapy    Hyperkalemic periodic paralysis    Hypertension    Narcolepsy    Neurogenic bladder disorder    Prostate cancer ([place])    prostate   Sclerosing mesenteritis ([place])    Testicular cancer ([place])    Past Surgical History:  Procedure Laterality Date   CATARACT EXTRACTION Bilateral    COLONOSCOPY  2014   hyperplastic   ESOPHAGOGASTRODUODENOSCOPY     HERNIA REPAIR     LIPOMA EXCISION     ORCHIECTOMY  2014   PROSTATECTOMY  2008   RETINAL TEAR REPAIR CRYOTHERAPY Left    TONSILLECTOMY     TOTAL HIP ARTHROPLASTY Bilateral 2018   TOTAL HIP ARTHROPLASTY Right 2019   UPPER GASTROINTESTINAL ENDOSCOPY  2016   VASECTOMY     Social History   Social History Narrative   Patient is a widowed and retired person.  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  He moved to [place] to be near his son and grandchildren and he has a daughter in [place] as well.      Drinks 1 coffee and 2 teas a day.  No drug use no tobacco never smoker 2 to 3 glasses of wine per month.   family history includes Arthritis/Rheumatoid in his mother; Cancer in his mother; Colon cancer in his maternal grandmother; Heart disease in his father; Pancreatic cancer in his father; Pernicious anemia in his mother; Varicose Veins in his father.   Review of Systems As above  Objective:   Physical Exam  BP 132/62   Pulse 72   Ht _0  (1.854 m)   Wt 203 lb (92.1 kg)   BMI 26.78 kg/m   21 minutes total time spent on this visit

## 2020-10-31 ENCOUNTER — Encounter: Payer: Self-pay | Admitting: Internal Medicine

## 2020-11-01 DIAGNOSIS — Z20822 Contact with and (suspected) exposure to covid-19: Secondary | ICD-10-CM | POA: Diagnosis not present

## 2020-11-16 ENCOUNTER — Encounter: Payer: Self-pay | Admitting: Physical Therapy

## 2020-11-16 ENCOUNTER — Ambulatory Visit: Payer: PPO | Attending: Internal Medicine | Admitting: Physical Therapy

## 2020-11-16 ENCOUNTER — Other Ambulatory Visit: Payer: Self-pay

## 2020-11-16 DIAGNOSIS — R269 Unspecified abnormalities of gait and mobility: Secondary | ICD-10-CM | POA: Diagnosis not present

## 2020-11-16 DIAGNOSIS — M6281 Muscle weakness (generalized): Secondary | ICD-10-CM | POA: Insufficient documentation

## 2020-11-16 DIAGNOSIS — R159 Full incontinence of feces: Secondary | ICD-10-CM | POA: Insufficient documentation

## 2020-11-16 DIAGNOSIS — R279 Unspecified lack of coordination: Secondary | ICD-10-CM | POA: Insufficient documentation

## 2020-11-16 DIAGNOSIS — R152 Fecal urgency: Secondary | ICD-10-CM | POA: Diagnosis not present

## 2020-11-16 DIAGNOSIS — G47411 Narcolepsy with cataplexy: Secondary | ICD-10-CM | POA: Diagnosis not present

## 2020-11-16 NOTE — Therapy (Signed)
[facility] @ [address] [place], [place], [address] Phone: [phone]   Fax:  [phone]  Physical Therapy Evaluation  Patient Details  Name: Keith Walsh MRN: [id number] Date of Birth: [birth year] Referring Provider (PT): Gatha Mayer, MD   Encounter Date: 11/16/2020   PT End of Session - 11/16/20 1452     Visit Number 1    Date for PT Re-Evaluation 02/16/21    Authorization Type [employer]    Activity Tolerance Patient tolerated treatment well    Behavior During Therapy [facility] for tasks assessed/performed             Past Medical History:  Diagnosis Date   Avascular necrosis of femoral head ([place])    Cataract    Colitis    Eosinophilic esophagitis 1607   Erectile dysfunction following radiation therapy    Hyperkalemic periodic paralysis    Hypertension    Narcolepsy    Neurogenic bladder disorder    Prostate cancer ([place])    prostate   Sclerosing mesenteritis ([place])    Testicular cancer ([place])     Past Surgical History:  Procedure Laterality Date   CATARACT EXTRACTION Bilateral    COLONOSCOPY  2014   hyperplastic   ESOPHAGOGASTRODUODENOSCOPY     HERNIA REPAIR     LIPOMA EXCISION     ORCHIECTOMY  2014   PROSTATECTOMY  2008   RETINAL TEAR REPAIR CRYOTHERAPY Left    TONSILLECTOMY     TOTAL HIP ARTHROPLASTY Bilateral 2018   TOTAL HIP ARTHROPLASTY Right 2019   UPPER GASTROINTESTINAL ENDOSCOPY  2016   VASECTOMY      There were no vitals filed for this visit.    Subjective Assessment - 11/16/20 1359     Subjective Pt reports a lifelong neurogenic bladder with a genetic disorder made worse at 70 and 60 had radiation s/p prostatectomy (2008, 2012 [facility] radiation). Pt reports fecal incontinence occurs mostly in morning without, no bleeding with BMs, does have hemorrhoids since having prostate CA, doesn't feel like he needs to strain to have BMs, does feel like he is able to fully evacuate  each time, and usually has minimum of 6 BMs per day not uncommon to have 12 BMs in one day ranging in types on average type 5-6 and sometimes type 4 others with constipation symptoms more type 2. Pt reports he tends to eat a healthy diet, more fruits and veggies and small amounts of carbs and meat.    Pertinent History hyperkalemic periodic paralysis with paramyotonia congenita which limits mobility and walking or even kegels will cause muscle weakening and or paralysis worsening with inceased level of activity, also if too cold can cause pt to go spastic; bil hip replacement; history of eosinophilic esophagitis and previous radiation induced proctitis/telangiectasia and bowel habit changes and a history of sclerosing mesenteritis, Diverticulosis    Limitations Standing;Walking;House hold activities    How long can you sit comfortably? no limits    How long can you stand comfortably? 10 mins at most    How long can you walk comfortably? 5 mins    Patient Stated Goals to have less fecal incontinence    Currently in Pain? No/denies   pt reports some pain throughout all the time, but reports he is not having additoinal pain this date.               [facility] PT Assessment - 11/16/20 0001       Assessment  Medical Diagnosis R15.9,R15.2 (ICD-10-CM) - Incontinence of feces with fecal urgency    Referring Provider (PT) Gatha Mayer, MD    Onset Date/Surgical Date --   several years   Prior Therapy yes for hip replacements but not for PFPT      Precautions   Precautions None      Restrictions   Weight Bearing Restrictions No      Balance Screen   Has the patient fallen in the past 6 months No    Has the patient had a decrease in activity level because of a fear of falling?  No    Is the patient reluctant to leave their home because of a fear of falling?  No      Home Ecologist residence    Living Arrangements Alone      Prior Function   Level of  [place] Retired      Associate Professor   Overall Cognitive Status Within Functional Limits for tasks assessed      Sensation   Light Touch Impaired Detail    Additional Comments bil calves and feet numb ATT      Coordination   Gross Motor Movements are Fluid and Coordinated Yes    Fine Motor Movements are Fluid and Coordinated Yes      Posture/Postural Control   Posture/Postural Control No significant limitations      ROM / Strength   AROM / PROM / Strength AROM;Strength      AROM   Overall AROM Comments thoracic and lumbar spine limited by 25% in flexion, side bending and rotation      Strength   Overall Strength Comments 4/5 bil hips in all directions      Flexibility   Soft Tissue Assessment /Muscle Length yes   bil hamstrings limited by 25%                       Objective measurements completed on examination: See above findings.     Pelvic Floor Special Questions - 11/16/20 0001     Prior Pelvic/Prostate Exam Yes   had prostate removed with CA, now 10 years in remission   Currently Sexually Active No    History of sexually transmitted disease No    Urinary Leakage Yes    How often worse with genetic disorder worse, if low symptoms with genetic disorder will have less leakage. Greatly improved with dicyclomine and no longer leaking.    Pad use yes for urinary leakage and wears through the night and until sure he no longer needs to have a BM.    Activities that cause leaking With strong urge;Other    Other activities that cause leaking and full bladder and and does self cath 4x/day for years due to retention    Urinary urgency No   with dicyclomine   Urinary frequency ~1.5 hour and hasn't had to self cath with starting dicyclomine    Fecal incontinence Yes   Has had a full loss of feces without urge and no control but also has fairly regular fecal smearing or leaking with passing gas.   Fluid intake 6L    Caffeine beverages tea in  morning    Falling out feeling (prolapse) No    Pelvic Floor Internal Exam deferred                       PT Education -  11/16/20 1451     Education Details Pt educated on male pelvic floor, fiber types, squatty potty,abdominal massage and POC    Person(s) Educated Patient    Methods Explanation;Demonstration;Tactile cues;Verbal cues;Handout    Comprehension Verbalized understanding;Returned demonstration              PT Short Term Goals - 11/16/20 1503       PT SHORT TERM GOAL #1   Title pt to be I with HEP    Time 6    Period Weeks    Status New    Target Date 12/28/20      PT SHORT TERM GOAL #2   Title pt to demonstrate at least 3/5 pelvic floor strength to improve ability to prevent stool leakage    Time 6    Period Weeks    Status New    Target Date 12/28/20      PT SHORT TERM GOAL #3   Title pt to report no more than x4 BMs per day to improve consistency and regularity of bowel movements and on average type 4    Time 6    Period Weeks    Status New    Target Date 12/28/20               PT Long Term Goals - 11/16/20 1504       PT LONG TERM GOAL #1   Title Pt to be I with advanced HEP    Time 3    Period Months    Status New    Target Date 02/16/21      PT LONG TERM GOAL #2   Title pt to demonstrate at least 4/5 pelvic floor strength to improve ability to prevent stool leakage    Time 3    Period Months    Status New    Target Date 02/16/21      PT LONG TERM GOAL #3   Title pt to report no more than x2 BMs per day to improve consistency and regularity of bowel movements and on average type 3-4    Time 3    Period Months    Status New    Target Date 02/16/21      PT LONG TERM GOAL #4   Title pt to demonstrate improved coordination of breathing mechanics and pelvic floor contract/relax during activities at least 75% of the time to improve ability pass stool effectively.    Time 3    Period Months    Status New    Target  Date 02/16/21      PT LONG TERM GOAL #5   Title pt to demonstrate ability to hold isometric contraction of pelvic floor for at least 30 seconds at rectum to decrease leakage.    Time 3    Period Months    Status New    Target Date 02/16/21                    Plan - 11/16/20 1453     Clinical Impression Statement Pt is 70yo male presenting to clinic with chronic history of neurogenic bladder with a genetic disorder made worse at 65 and 64 post radiation has needed to use pads and self cathing throughout life/long term however this has recently been improving with use of dicyclomine currently, had prostate CA and radiation s/p prostatectomy (2008, 2012 [facility] radiation). Pt reports fecal incontinence has worsened over the past few weeks initially starting post rediation in 2012  infrequently to now having between 6-12 BMs per day with intermittent leakage thorughout the day with any need to pass gas, wears pads for urinary and fecal leakage. Stools vary in type per [facility] chart from 2-6, on average usually a 6. Pt reports he doesn't need to strain and usually has full evacuation within a minute with BMs but has them multiple times per day. Pt has hyperkalemic periodic paralysis with paramyotonia congenita which limits mobility overall and reports he can be spastic when cold, or can go into paralysis with high levels of fatigue/mobility and can sometimes attempt kegels at home and can do well with them other times 8 reps and will cause decreased muslce control. Pt reports he is able to determine when this is happening and demonstrates stuttering and/or "off" behaviors and with rest will improve but requests light exercises low reps to prevent this from happening. Pt educated on voiding mechanics, breathing mechanics and abdominal massage for home use between now and next session. Pt is agreeable to internal assessment however deferred to next session due to time.    Personal Factors and  Comorbidities Time since onset of injury/illness/exacerbation;Comorbidity 3+    Comorbidities hyperkalemic periodic paralysis with paramyotonia congenita, history of eosinophilic esophagitis and previous radiation induced proctitis/telangiectasia and bowel habit changes and a history of sclerosing mesenteritis, Diverticulosis    Examination-Activity Limitations Locomotion Level;Transfers;Stand;Hygiene/Grooming;Continence;Toileting    Examination-Participation Restrictions Community Activity;Interpersonal Relationship;Yard Work;Shop    Stability/Clinical Decision Making Unstable/Unpredictable    Clinical Decision Making High    Rehab Potential Fair    PT Frequency 1x / week    PT Duration Other (comment)   10 weeks   PT Treatment/Interventions ADLs/Self Care Home Management;Functional mobility training;Therapeutic activities;Therapeutic exercise;Neuromuscular re-education;Aquatic Therapy;Manual techniques;Patient/family education;Scar mobilization;Passive range of motion;Energy conservation;Taping    PT Next Visit Plan internal as needed, go over all handouts    Consulted and Agree with Plan of Care Patient             Patient will benefit from skilled therapeutic intervention in order to improve the following deficits and impairments:  Decreased endurance, Decreased coordination, Difficulty walking, Increased fascial restricitons, Impaired tone, Increased muscle spasms, Decreased activity tolerance, Decreased mobility, Decreased strength, Postural dysfunction, Impaired sensation, Improper body mechanics, Impaired flexibility, Pain  Visit Diagnosis: Lack of coordination - Plan: PT eval and treat  Muscle weakness (generalized) - Plan: PT eval and treat  Abnormality of gait and mobility - Plan: PT eval and treat     Problem List Patient Active Problem List   Diagnosis Date Noted   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

## 2020-12-07 ENCOUNTER — Encounter: Payer: Self-pay | Admitting: Physical Therapy

## 2020-12-07 ENCOUNTER — Ambulatory Visit: Payer: PPO | Admitting: Physical Therapy

## 2020-12-07 ENCOUNTER — Other Ambulatory Visit: Payer: Self-pay

## 2020-12-07 DIAGNOSIS — R269 Unspecified abnormalities of gait and mobility: Secondary | ICD-10-CM

## 2020-12-07 DIAGNOSIS — R279 Unspecified lack of coordination: Secondary | ICD-10-CM | POA: Diagnosis not present

## 2020-12-07 DIAGNOSIS — M6281 Muscle weakness (generalized): Secondary | ICD-10-CM

## 2020-12-07 NOTE — Therapy (Signed)
[facility] @ [address] [place], [place], [address] Phone: [phone]   Fax:  [phone]  Physical Therapy Treatment  Patient Details  Name: Keith Walsh MRN: [id number] Date of Birth: 09-27-1950 Referring Provider (PT): Gatha Mayer, MD   Encounter Date: 12/07/2020   PT End of Session - 12/07/20 1203     Visit Number 2    Authorization Type [employer]    PT Start Time 1107    PT Stop Time 1151    PT Time Calculation (min) 44 min             Past Medical History:  Diagnosis Date   Avascular necrosis of femoral head ([place])    Cataract    Colitis    Eosinophilic esophagitis 4854   Erectile dysfunction following radiation therapy    Hyperkalemic periodic paralysis    Hypertension    Narcolepsy    Neurogenic bladder disorder    Prostate cancer ([place])    prostate   Sclerosing mesenteritis ([place])    Testicular cancer ([place])     Past Surgical History:  Procedure Laterality Date   CATARACT EXTRACTION Bilateral    COLONOSCOPY  2014   hyperplastic   ESOPHAGOGASTRODUODENOSCOPY     HERNIA REPAIR     LIPOMA EXCISION     ORCHIECTOMY  2014   PROSTATECTOMY  2008   RETINAL TEAR REPAIR CRYOTHERAPY Left    TONSILLECTOMY     TOTAL HIP ARTHROPLASTY Bilateral 2018   TOTAL HIP ARTHROPLASTY Right 2019   UPPER GASTROINTESTINAL ENDOSCOPY  2016   VASECTOMY      There were no vitals filed for this visit.   Subjective Assessment - 12/07/20 1109     Subjective Pt reports he has been very busy with kitchen reno and this has been stessful which can trigger symptoms as well as cold and loud noises. Therefore pt reports he has been having an increase in symptoms. Has had a few days of increased BMs per day every 2 hours with type 7, leakage as well with loose smearing, not full lose of bowels but leakage. has been changing diet and stopped coffee. Pt also feels like he needs to see a neurologist,  has been 2  years since seeing one and he thinks he has gotten much weaker in both legs with his disorder.    Pertinent History hyperkalemic periodic paralysis with paramyotonia congenita which limits mobility and walking or even kegels will cause muscle weakening and or paralysis worsening with inceased level of activity, also if too cold can cause pt to go spastic; bil hip replacement; history of eosinophilic esophagitis and previous radiation induced proctitis/telangiectasia and bowel habit changes and a history of sclerosing mesenteritis, Diverticulosis    Limitations Standing;Walking;House hold activities    How long can you sit comfortably? no limits    How long can you stand comfortably? 10 mins at most    How long can you walk comfortably? 5 mins    Patient Stated Goals to have less fecal incontinence    Currently in Pain? No/denies                               Keith Walsh - 12/07/20 0001       Self-Care   Self-Care Other Self-Care Comments    Other Self-Care Comments  Pt educated on peribottle use for hygiene post BMs as pt  reports he has been very irritated with increased BMs the past few days with increased stress. Relaxation and meditation technqiues as needed, abdominal massage to attempt to improve stool type and regularity. And to follow up with neurologist to further assess pt's disorder and possible worsening as pt has not seen one in 2 years since moving here per pt.                       PT Short Term Goals - 11/16/20 1503       PT SHORT TERM GOAL #1   Title pt to be I with HEP    Time 6    Period Weeks    Status New    Target Date 12/28/20      PT SHORT TERM GOAL #2   Title pt to demonstrate at least 3/5 pelvic floor strength to improve ability to prevent stool leakage    Time 6    Period Weeks    Status New    Target Date 12/28/20      PT SHORT TERM GOAL #3   Title pt to report no more than x4 BMs per day to  improve consistency and regularity of bowel movements and on average type 4    Time 6    Period Weeks    Status New    Target Date 12/28/20               PT Long Term Goals - 11/16/20 1504       PT LONG TERM GOAL #1   Title Pt to be I with advanced HEP    Time 3    Period Months    Status New    Target Date 02/16/21      PT LONG TERM GOAL #2   Title pt to demonstrate at least 4/5 pelvic floor strength to improve ability to prevent stool leakage    Time 3    Period Months    Status New    Target Date 02/16/21      PT LONG TERM GOAL #3   Title pt to report no more than x2 BMs per day to improve consistency and regularity of bowel movements and on average type 3-4    Time 3    Period Months    Status New    Target Date 02/16/21      PT LONG TERM GOAL #4   Title pt to demonstrate improved coordination of breathing mechanics and pelvic floor contract/relax during activities at least 75% of the time to improve ability pass stool effectively.    Time 3    Period Months    Status New    Target Date 02/16/21      PT LONG TERM GOAL #5   Title pt to demonstrate ability to hold isometric contraction of pelvic floor for at least 30 seconds at rectum to decrease leakage.    Time 3    Period Months    Status New    Target Date 02/16/21                   Plan - 12/07/20 1203     Clinical Impression Statement Pt presents to clinic reporting continued symptoms, likes squatty potty but does cause hip pain sometimes, has been stressed and noticed triggers for his disorder present lately with cold, loud noises and stress with kitchen reno and thins this has made his symptoms somewhat worse. He has been  having BMs every two hours and intermittent stool leakage with skin in anal area very irritated per pt. Pt educated to use peribottle for hygiene as needed, timed voiding, and abdominal massage as needed. Pt reports he thinks his leg strength and endurance has worsened  overall progressively, hasn't seen a neurologist in the last 2 years since moving to [place] and educated to do so as he has also had a progressive worsening in pelvic floor/bowel/bladder problems as well. Pt understands he needs to see neurologist and agrees. Pt also educated on techniques for pelvic relaxation with spastic episodic days and activation techniques with more flaccid days as he is inconsistent. Pt agreed understanding and agreeable to internal assessment next visit.    Personal Factors and Comorbidities Time since onset of injury/illness/exacerbation;Comorbidity 3+    Comorbidities hyperkalemic periodic paralysis with paramyotonia congenita, history of eosinophilic esophagitis and previous radiation induced proctitis/telangiectasia and bowel habit changes and a history of sclerosing mesenteritis, Diverticulosis    Examination-Activity Limitations Locomotion Level;Transfers;Stand;Hygiene/Grooming;Continence;Toileting    Stability/Clinical Decision Making Unstable/Unpredictable    Clinical Decision Making High    Rehab Potential Fair    PT Frequency 1x / week    PT Duration Other (comment)    PT Treatment/Interventions ADLs/Self Care Home Management;Functional mobility training;Therapeutic activities;Therapeutic exercise;Neuromuscular re-education;Aquatic Therapy;Manual techniques;Patient/family education;Scar mobilization;Passive range of motion;Energy conservation;Taping    PT Next Visit Plan internal    Consulted and Agree with Plan of Care Patient             Patient will benefit from skilled therapeutic intervention in order to improve the following deficits and impairments:  Decreased endurance, Decreased coordination, Difficulty walking, Increased fascial restricitons, Impaired tone, Increased muscle spasms, Decreased activity tolerance, Decreased mobility, Decreased strength, Postural dysfunction, Impaired sensation, Improper body mechanics, Impaired flexibility, Pain  Visit  Diagnosis: Lack of coordination  Muscle weakness (generalized)  Abnormality of gait and mobility     Problem List Patient Active Problem List   Diagnosis Date Noted   Chronic colitis after radiation tx 90/30/0923   Eosinophilic esophagitis 30/07/6224   Hypokalemic familial periodic paralysis 07/06/2020   Radiation enterocolitis 07/06/2020   Sclerosing mesenteritis ([place]) 07/06/2020    Keith Walsh, PT 12/07/2020, 12:38 PM  [facility] @ [facility] [place], [place], [address] Phone: [phone]   Fax:  [phone]  Name: KYM FENTER MRN: [id number] Date of Birth: 06-06-1950

## 2020-12-12 DIAGNOSIS — G47411 Narcolepsy with cataplexy: Secondary | ICD-10-CM | POA: Diagnosis not present

## 2020-12-12 DIAGNOSIS — Z8546 Personal history of malignant neoplasm of prostate: Secondary | ICD-10-CM | POA: Diagnosis not present

## 2020-12-12 DIAGNOSIS — N319 Neuromuscular dysfunction of bladder, unspecified: Secondary | ICD-10-CM | POA: Diagnosis not present

## 2020-12-12 DIAGNOSIS — H8110 Benign paroxysmal vertigo, unspecified ear: Secondary | ICD-10-CM | POA: Diagnosis not present

## 2020-12-12 DIAGNOSIS — E785 Hyperlipidemia, unspecified: Secondary | ICD-10-CM | POA: Diagnosis not present

## 2020-12-12 DIAGNOSIS — R21 Rash and other nonspecific skin eruption: Secondary | ICD-10-CM | POA: Diagnosis not present

## 2020-12-12 DIAGNOSIS — G723 Periodic paralysis: Secondary | ICD-10-CM | POA: Diagnosis not present

## 2020-12-12 DIAGNOSIS — I700 Atherosclerosis of aorta: Secondary | ICD-10-CM | POA: Diagnosis not present

## 2020-12-12 DIAGNOSIS — R159 Full incontinence of feces: Secondary | ICD-10-CM | POA: Diagnosis not present

## 2020-12-12 DIAGNOSIS — I10 Essential (primary) hypertension: Secondary | ICD-10-CM | POA: Diagnosis not present

## 2020-12-15 ENCOUNTER — Other Ambulatory Visit: Payer: Self-pay

## 2020-12-15 ENCOUNTER — Ambulatory Visit: Payer: PPO | Attending: Internal Medicine | Admitting: Physical Therapy

## 2020-12-15 DIAGNOSIS — R269 Unspecified abnormalities of gait and mobility: Secondary | ICD-10-CM | POA: Insufficient documentation

## 2020-12-15 DIAGNOSIS — R279 Unspecified lack of coordination: Secondary | ICD-10-CM | POA: Insufficient documentation

## 2020-12-15 DIAGNOSIS — M6281 Muscle weakness (generalized): Secondary | ICD-10-CM | POA: Diagnosis not present

## 2020-12-15 NOTE — Therapy (Signed)
[facility] @ [address] [place], [place], [address] Phone: [phone]   Fax:  [phone]  Physical Therapy Treatment  Patient Details  Name: Keith Walsh MRN: [id number] Date of Birth: 1950-11-26 Referring Provider (PT): Gatha Mayer, MD   Encounter Date: 12/15/2020   PT End of Session - 12/15/20 1311     Visit Number 3    Authorization Type [employer]    PT Start Time 9381    PT Stop Time 1310    PT Time Calculation (min) 40 min    Activity Tolerance Patient tolerated treatment well;Patient limited by fatigue    Behavior During Therapy [facility] for tasks assessed/performed             Past Medical History:  Diagnosis Date   Avascular necrosis of femoral head ([place])    Cataract    Colitis    Eosinophilic esophagitis 0175   Erectile dysfunction following radiation therapy    Hyperkalemic periodic paralysis    Hypertension    Narcolepsy    Neurogenic bladder disorder    Prostate cancer ([place])    prostate   Sclerosing mesenteritis ([place])    Testicular cancer ([place])     Past Surgical History:  Procedure Laterality Date   CATARACT EXTRACTION Bilateral    COLONOSCOPY  2014   hyperplastic   ESOPHAGOGASTRODUODENOSCOPY     HERNIA REPAIR     LIPOMA EXCISION     ORCHIECTOMY  2014   PROSTATECTOMY  2008   RETINAL TEAR REPAIR CRYOTHERAPY Left    TONSILLECTOMY     TOTAL HIP ARTHROPLASTY Bilateral 2018   TOTAL HIP ARTHROPLASTY Right 2019   UPPER GASTROINTESTINAL ENDOSCOPY  2016   VASECTOMY      There were no vitals filed for this visit.   Subjective Assessment - 12/15/20 1233     Subjective Pt reports he continues to have stress with kitchen remodel. Pt reports he has a change in diet with holidays and has noted increased urgency and frequency for BMs but no smearing, has had small amounts of leakage which irritates the skin but resolved with cleaning.    Pertinent History hyperkalemic periodic  paralysis with paramyotonia congenita which limits mobility and walking or even kegels will cause muscle weakening and or paralysis worsening with inceased level of activity, also if too cold can cause pt to go spastic; bil hip replacement; history of eosinophilic esophagitis and previous radiation induced proctitis/telangiectasia and bowel habit changes and a history of sclerosing mesenteritis, Diverticulosis    Limitations Standing;Walking;House hold activities    How long can you sit comfortably? no limits    How long can you stand comfortably? 10 mins at most    How long can you walk comfortably? 5 mins    Patient Stated Goals to have less fecal incontinence    Currently in Pain? No/denies              No emotional/communication barriers or cognitive limitation. Patient is motivated to learn. Patient understands and agrees with treatment goals and plan. PT explains patient will be examined in standing, sitting, and lying down to see how their muscles and joints work. When they are ready, they will be asked to remove their underwear so PT can examine their perineum. The patient is also given the option of providing their own chaperone as one is not provided in our facility. The patient also has the right and is explained the right to defer or  refuse any part of the evaluation or treatment including the internal exam. With the patient's consent, PT will use one gloved finger to gently assess the muscles of the pelvic floor, seeing how well it contracts and relaxes and if there is muscle symmetry. After, the patient will get dressed and PT and patient will discuss exam findings and plan of care. PT and patient discuss plan of care, schedule, attendance policy and HEP activities.               Pelvic Floor Special Questions - 12/15/20 0001     External Perineal Exam redness and irritation present at rectum and surronding skin, excess skin    Prolapse None    Pelvic Floor Internal Exam  patient identified and patient confirms consent for PT to perform internal soft tissue work and muscle strength and integrity assessment    Exam Type Rectal    Sensation WFL    Palpation no TTP internally    Strength --   4/5 first rep, 3/5, 2/5 on third rep.   Strength # of reps 1    Strength # of seconds 1    Tone decreased               [facility] Adult PT Treatment/Exercise - 12/15/20 0001       Self-Care   Self-Care Other Self-Care Comments    Other Self-Care Comments  pt educated on attempts to contract with transfers and skin care at rectum due to fecal leakage.      Manual Therapy   Manual Therapy Internal Pelvic Floor    Internal Pelvic Floor Pt consented to internal rectal assessment: attempted to do x5 reps of contractions and pt only able to compelte 3 with rest breaks between due to noting he was very fatigued. Pt very limited with endurance and strength training due to genetic disorder. Pt also demonstrated weakness tone and strength with each rep and only able to hold each for one second or less.                     PT Education - 12/15/20 1310     Education Details Pt educated to attempt rectal contraction for one second with transfers from sit>standing to improve functionality of strengthening and attempt to decrease fatigue.    Person(s) Educated Patient    Methods Explanation;Tactile cues;Demonstration;Verbal cues    Comprehension Verbalized understanding;Returned demonstration              PT Short Term Goals - 11/16/20 1503       PT SHORT TERM GOAL #1   Title pt to be I with HEP    Time 6    Period Weeks    Status New    Target Date 12/28/20      PT SHORT TERM GOAL #2   Title pt to demonstrate at least 3/5 pelvic floor strength to improve ability to prevent stool leakage    Time 6    Period Weeks    Status New    Target Date 12/28/20      PT SHORT TERM GOAL #3   Title pt to report no more than x4 BMs per day to improve consistency  and regularity of bowel movements and on average type 4    Time 6    Period Weeks    Status New    Target Date 12/28/20               PT  Long Term Goals - 11/16/20 1504       PT LONG TERM GOAL #1   Title Pt to be I with advanced HEP    Time 3    Period Months    Status New    Target Date 02/16/21      PT LONG TERM GOAL #2   Title pt to demonstrate at least 4/5 pelvic floor strength to improve ability to prevent stool leakage    Time 3    Period Months    Status New    Target Date 02/16/21      PT LONG TERM GOAL #3   Title pt to report no more than x2 BMs per day to improve consistency and regularity of bowel movements and on average type 3-4    Time 3    Period Months    Status New    Target Date 02/16/21      PT LONG TERM GOAL #4   Title pt to demonstrate improved coordination of breathing mechanics and pelvic floor contract/relax during activities at least 75% of the time to improve ability pass stool effectively.    Time 3    Period Months    Status New    Target Date 02/16/21      PT LONG TERM GOAL #5   Title pt to demonstrate ability to hold isometric contraction of pelvic floor for at least 30 seconds at rectum to decrease leakage.    Time 3    Period Months    Status New    Target Date 02/16/21                   Plan - 12/15/20 1311     Clinical Impression Statement Pt presents to clinic reporting he has not had smearing and only small fecal leakages and has been having 1-3 BMs per day, less liquid consistency per pt which has helped. Pt reports addominal massage has helped as well. Pt consented to internal rectal exam and found to have very red, irritated skin around rectum and surrounding skin, excess skin present at rectum as well and pt reported TTP at external skin due to irritation. Pt had no pain with internal exam and demonstrated 4/5 contraction strength at EAS and pubrorectalis on first rep, then 3/5 seond rep and 2/5 for third rep. Pt  required rest breaks between each rep and demonstrated poor enduranace with ability to hold contraction for 1s on first rep and less than 1s each after. Pt reported he felt very fatigued and with genetic disorder unable to continue without risk of worsening mobility/function after assessment as pt sometimes feels more spastic or more flaccid post fatigue from exercise. Pt tolerated palpation well and denied TTP. Pt educated on findings and to attempt one rectal contraction with transferring from sit>standing to imrpove function with kegels with attempts to limit fatigue with pelvic floor training. Pt agreed understanding and agreeable to internal assessment next visit.    Personal Factors and Comorbidities Time since onset of injury/illness/exacerbation;Comorbidity 3+    Comorbidities hyperkalemic periodic paralysis with paramyotonia congenita, history of eosinophilic esophagitis and previous radiation induced proctitis/telangiectasia and bowel habit changes and a history of sclerosing mesenteritis, Diverticulosis    Examination-Activity Limitations Locomotion Level;Transfers;Stand;Hygiene/Grooming;Continence;Toileting    Stability/Clinical Decision Making Unstable/Unpredictable    Rehab Potential Fair    PT Frequency 1x / week    PT Duration Other (comment)    PT Treatment/Interventions ADLs/Self Care Home Management;Functional mobility training;Therapeutic activities;Therapeutic exercise;Neuromuscular re-education;Aquatic  Therapy;Manual techniques;Patient/family education;Scar mobilization;Passive range of motion;Energy conservation;Taping    PT Next Visit Plan relacxing techniques and gentle contractions    Consulted and Agree with Plan of Care Patient             Patient will benefit from skilled therapeutic intervention in order to improve the following deficits and impairments:  Decreased endurance, Decreased coordination, Difficulty walking, Increased fascial restricitons, Impaired tone,  Increased muscle spasms, Decreased activity tolerance, Decreased mobility, Decreased strength, Postural dysfunction, Impaired sensation, Improper body mechanics, Impaired flexibility, Pain  Visit Diagnosis: Lack of coordination  Muscle weakness (generalized)  Abnormality of gait and mobility     Problem List Patient Active Problem List   Diagnosis Date Noted   Chronic colitis after radiation tx 29/51/8841   Eosinophilic esophagitis 66/06/3014   Hypokalemic familial periodic paralysis 07/06/2020   Radiation enterocolitis 07/06/2020   Sclerosing mesenteritis ([place]) 07/06/2020   Stacy Gardner, PT, DPT 12/01/221:18 PM   [facility] @ [place], [place], [address] Phone: [phone]   Fax:  [phone]  Name: Keith Walsh MRN: [id number] Date of Birth: 25-Sep-1950

## 2020-12-20 ENCOUNTER — Ambulatory Visit: Payer: PPO | Admitting: Physical Therapy

## 2020-12-20 ENCOUNTER — Other Ambulatory Visit: Payer: Self-pay

## 2020-12-20 DIAGNOSIS — R279 Unspecified lack of coordination: Secondary | ICD-10-CM | POA: Diagnosis not present

## 2020-12-20 DIAGNOSIS — R269 Unspecified abnormalities of gait and mobility: Secondary | ICD-10-CM

## 2020-12-20 DIAGNOSIS — M6281 Muscle weakness (generalized): Secondary | ICD-10-CM

## 2020-12-20 NOTE — Therapy (Signed)
[facility] @ [place], [place], [address] Phone: [phone]   Fax:  [phone]  Physical Therapy Treatment  Patient Details  Name: Keith Walsh MRN: [id number] Date of Birth: 1950-10-20 Referring Provider (PT): Gatha Mayer, MD   Encounter Date: 12/20/2020   PT End of Session - 12/20/20 1528     Visit Number 4    Date for PT Re-Evaluation 02/16/21    Authorization Type [employer]    PT Start Time 2595    PT Stop Time 1530    PT Time Calculation (min) 39 min    Activity Tolerance Patient tolerated treatment well;Patient limited by fatigue    Behavior During Therapy [facility] for tasks assessed/performed             Past Medical History:  Diagnosis Date   Avascular necrosis of femoral head ([place])    Cataract    Colitis    Eosinophilic esophagitis 6387   Erectile dysfunction following radiation therapy    Hyperkalemic periodic paralysis    Hypertension    Narcolepsy    Neurogenic bladder disorder    Prostate cancer ([place])    prostate   Sclerosing mesenteritis ([place])    Testicular cancer ([place])     Past Surgical History:  Procedure Laterality Date   CATARACT EXTRACTION Bilateral    COLONOSCOPY  2014   hyperplastic   ESOPHAGOGASTRODUODENOSCOPY     HERNIA REPAIR     LIPOMA EXCISION     ORCHIECTOMY  2014   PROSTATECTOMY  2008   RETINAL TEAR REPAIR CRYOTHERAPY Left    TONSILLECTOMY     TOTAL HIP ARTHROPLASTY Bilateral 2018   TOTAL HIP ARTHROPLASTY Right 2019   UPPER GASTROINTESTINAL ENDOSCOPY  2016   VASECTOMY      There were no vitals filed for this visit.   Subjective Assessment - 12/20/20 1453     Subjective Pt reports he contines to be very busy and had lots of family home for the holidays and traveling to/from airports. Pt reports he feels better than previous session but still had stressors which keeps symptoms flared. Pt report he is now having 2-3 BMs in AM on average  instead of 2-6. Pt does also still report mild leakage of stool though much less frequently and only 1-2x times since last session.    Pertinent History hyperkalemic periodic paralysis with paramyotonia congenita which limits mobility and walking or even kegels will cause muscle weakening and or paralysis worsening with inceased level of activity, also if too cold can cause pt to go spastic; bil hip replacement; history of eosinophilic esophagitis and previous radiation induced proctitis/telangiectasia and bowel habit changes and a history of sclerosing mesenteritis, Diverticulosis    How long can you sit comfortably? no limits    How long can you stand comfortably? this has gotten better to now around 40 mins-hour but notices some swelling and very tiring    How long can you walk comfortably? 20 mins but exhausting    Patient Stated Goals to have less fecal incontinence    Currently in Pain? No/denies                               [facility] Adult PT Treatment/Exercise - 12/20/20 0001       Self-Care   Self-Care Other Self-Care Comments    Other Self-Care Comments  Pt educated on HEP for increasing activity  tolerance and pelvic floor contractions with transfers and tasks to improve strength and decrease leakage. Pt also educated on continuing to attempt to see a neurologist as he has not since moving to [place] 2 years ago.                       PT Short Term Goals - 11/16/20 1503       PT SHORT TERM GOAL #1   Title pt to be I with HEP    Time 6    Period Weeks    Status New    Target Date 12/28/20      PT SHORT TERM GOAL #2   Title pt to demonstrate at least 3/5 pelvic floor strength to improve ability to prevent stool leakage    Time 6    Period Weeks    Status New    Target Date 12/28/20      PT SHORT TERM GOAL #3   Title pt to report no more than x4 BMs per day to improve consistency and regularity of bowel movements and on average type 4    Time 6     Period Weeks    Status New    Target Date 12/28/20               PT Long Term Goals - 11/16/20 1504       PT LONG TERM GOAL #1   Title Pt to be I with advanced HEP    Time 3    Period Months    Status New    Target Date 02/16/21      PT LONG TERM GOAL #2   Title pt to demonstrate at least 4/5 pelvic floor strength to improve ability to prevent stool leakage    Time 3    Period Months    Status New    Target Date 02/16/21      PT LONG TERM GOAL #3   Title pt to report no more than x2 BMs per day to improve consistency and regularity of bowel movements and on average type 3-4    Time 3    Period Months    Status New    Target Date 02/16/21      PT LONG TERM GOAL #4   Title pt to demonstrate improved coordination of breathing mechanics and pelvic floor contract/relax during activities at least 75% of the time to improve ability pass stool effectively.    Time 3    Period Months    Status New    Target Date 02/16/21      PT LONG TERM GOAL #5   Title pt to demonstrate ability to hold isometric contraction of pelvic floor for at least 30 seconds at rectum to decrease leakage.    Time 3    Period Months    Status New    Target Date 02/16/21                   Plan - 12/20/20 1529     Clinical Impression Statement Pt presents to clinic reporting he has had less leakage and less BM in AM compared to prior to PT. Pt still limited with diet which he knows plays a big role in his stool type and frequency. PT session focused on pt's many questions about things to try at home ,pt greatly limited in exercises able to complete during session due to fatigue and decreased tolerance to activity but very  motivated and complaint with attempting things at home thorughout the day. Pt has been completing kegels with transfers and lifting things to decrease leakage which has helped and more functional as he cannot tolerate increased reps or holds. Pt reported no additional  questions at this time at end session and educated on continued HEP and attempting to increase tolerance to activity in small amounts as tolerated at home. Pt agreed understanding and agreeable to internal assessment next visit.    Personal Factors and Comorbidities Time since onset of injury/illness/exacerbation;Comorbidity 3+    Comorbidities hyperkalemic periodic paralysis with paramyotonia congenita, history of eosinophilic esophagitis and previous radiation induced proctitis/telangiectasia and bowel habit changes and a history of sclerosing mesenteritis, Diverticulosis    Examination-Activity Limitations Locomotion Level;Transfers;Stand;Hygiene/Grooming;Continence;Toileting    Examination-Participation Restrictions Community Activity;Interpersonal Relationship;Yard Work;Shop    Stability/Clinical Decision Making Unstable/Unpredictable    Rehab Potential Fair    PT Frequency 1x / week    PT Duration Other (comment)    PT Treatment/Interventions ADLs/Self Care Home Management;Functional mobility training;Therapeutic activities;Therapeutic exercise;Neuromuscular re-education;Aquatic Therapy;Manual techniques;Patient/family education;Scar mobilization;Passive range of motion;Energy conservation;Taping    PT Next Visit Plan relacxing techniques and gentle contractions    Consulted and Agree with Plan of Care Patient             Patient will benefit from skilled therapeutic intervention in order to improve the following deficits and impairments:  Decreased endurance, Decreased coordination, Difficulty walking, Increased fascial restricitons, Impaired tone, Increased muscle spasms, Decreased activity tolerance, Decreased mobility, Decreased strength, Postural dysfunction, Impaired sensation, Improper body mechanics, Impaired flexibility, Pain, Decreased safety awareness  Visit Diagnosis: Lack of coordination  Abnormality of gait and mobility  Muscle weakness (generalized)     Problem  List Patient Active Problem List   Diagnosis Date Noted   Chronic colitis after radiation tx 32/20/2542   Eosinophilic esophagitis 70/62/3762   Hypokalemic familial periodic paralysis 07/06/2020   Radiation enterocolitis 07/06/2020   Sclerosing mesenteritis ([place]) 07/06/2020    Stacy Gardner, PT, DPT 12/06/224:22 PM   [facility] @ [place] [place], [place], [address] Phone: [phone]   Fax:  [phone]  Name: LACEY DOTSON MRN: [id number] Date of Birth: July 10, 1950

## 2020-12-27 ENCOUNTER — Encounter: Payer: PPO | Admitting: Physical Therapy

## 2021-01-03 ENCOUNTER — Other Ambulatory Visit: Payer: Self-pay

## 2021-01-03 ENCOUNTER — Ambulatory Visit: Payer: PPO | Admitting: Physical Therapy

## 2021-01-03 DIAGNOSIS — R269 Unspecified abnormalities of gait and mobility: Secondary | ICD-10-CM

## 2021-01-03 DIAGNOSIS — R279 Unspecified lack of coordination: Secondary | ICD-10-CM | POA: Diagnosis not present

## 2021-01-03 DIAGNOSIS — M6281 Muscle weakness (generalized): Secondary | ICD-10-CM

## 2021-01-03 NOTE — Therapy (Signed)
[facility] @ [place] [place], [place], [address] Phone: [phone]   Fax:  [phone]  Physical Therapy Treatment  Patient Details  Name: Keith Walsh MRN: [id number] Date of Birth: 1950-05-20 Referring Provider (PT): Gatha Mayer, MD   Encounter Date: 01/03/2021   PT End of Session - 01/03/21 1545     Visit Number 5    Date for PT Re-Evaluation 02/16/21    Authorization Type [employer]    PT Start Time 1450    PT Stop Time 1534    PT Time Calculation (min) 44 min    Activity Tolerance Patient tolerated treatment well;Patient limited by fatigue    Behavior During Therapy [facility] for tasks assessed/performed             Past Medical History:  Diagnosis Date   Avascular necrosis of femoral head ([place])    Cataract    Colitis    Eosinophilic esophagitis 3536   Erectile dysfunction following radiation therapy    Hyperkalemic periodic paralysis    Hypertension    Narcolepsy    Neurogenic bladder disorder    Prostate cancer ([place])    prostate   Sclerosing mesenteritis ([place])    Testicular cancer ([place])     Past Surgical History:  Procedure Laterality Date   CATARACT EXTRACTION Bilateral    COLONOSCOPY  2014   hyperplastic   ESOPHAGOGASTRODUODENOSCOPY     HERNIA REPAIR     LIPOMA EXCISION     ORCHIECTOMY  2014   PROSTATECTOMY  2008   RETINAL TEAR REPAIR CRYOTHERAPY Left    TONSILLECTOMY     TOTAL HIP ARTHROPLASTY Bilateral 2018   TOTAL HIP ARTHROPLASTY Right 2019   UPPER GASTROINTESTINAL ENDOSCOPY  2016   VASECTOMY      There were no vitals filed for this visit.   Subjective Assessment - 01/03/21 1543     Subjective --                               [facility] Adult PT Treatment/Exercise - 01/03/21 0001       Self-Care   Self-Care Other Self-Care Comments    Other Self-Care Comments  pt educated on fiber types, possible use of Metamucil but to clear with MD; yoga  ball for pelvic tilts in all directions and increasing mobility tolerance as able without exertion and can be used throughout the day to improve core activation as well. Pt agreed.      Exercises   Exercises Lumbar      Lumbar Exercises: Seated   Other Seated Lumbar Exercises on blue ball: x10 pelvic tilts ant/post, x10 rt/lt, x10 figure 8s and prolonged sitting at ball during education as pt reported this was more comfortable and educated on pelvic floor relaxation techniques at ball.                     PT Education - 01/03/21 1545     Education Details Pt educated on HEP with use of theraball for pelvic mobility and core activiation with demonstration and completion in session.    Person(s) Educated Patient    Methods Explanation;Demonstration;Tactile cues;Verbal cues    Comprehension Verbalized understanding;Returned demonstration              PT Short Term Goals - 11/16/20 1503       PT SHORT TERM GOAL #1   Title pt  to be I with HEP    Time 6    Period Weeks    Status New    Target Date 12/28/20      PT SHORT TERM GOAL #2   Title pt to demonstrate at least 3/5 pelvic floor strength to improve ability to prevent stool leakage    Time 6    Period Weeks    Status New    Target Date 12/28/20      PT SHORT TERM GOAL #3   Title pt to report no more than x4 BMs per day to improve consistency and regularity of bowel movements and on average type 4    Time 6    Period Weeks    Status New    Target Date 12/28/20               PT Long Term Goals - 11/16/20 1504       PT LONG TERM GOAL #1   Title Pt to be I with advanced HEP    Time 3    Period Months    Status New    Target Date 02/16/21      PT LONG TERM GOAL #2   Title pt to demonstrate at least 4/5 pelvic floor strength to improve ability to prevent stool leakage    Time 3    Period Months    Status New    Target Date 02/16/21      PT LONG TERM GOAL #3   Title pt to report no more than  x2 BMs per day to improve consistency and regularity of bowel movements and on average type 3-4    Time 3    Period Months    Status New    Target Date 02/16/21      PT LONG TERM GOAL #4   Title pt to demonstrate improved coordination of breathing mechanics and pelvic floor contract/relax during activities at least 75% of the time to improve ability pass stool effectively.    Time 3    Period Months    Status New    Target Date 02/16/21      PT LONG TERM GOAL #5   Title pt to demonstrate ability to hold isometric contraction of pelvic floor for at least 30 seconds at rectum to decrease leakage.    Time 3    Period Months    Status New    Target Date 02/16/21                   Plan - 01/03/21 1546     Clinical Impression Statement Pt presents to clinic reporting he continues to have less fecal leakage, no full loss of BM, and less urinary leakage. Pt reports he has noticed difficulty concentrating and has "brain fog" but MD reports this is due to a medication, and pt now weaning self off with MD aware. Pt session focused on educating pt on low impact activities to attempt to increase pelvic mobility and strengthening. Pt reports he had no symptoms of triggers/over exertion with these and very motivated to attempt this at home to improve mobility as he has felt limited due to functional decline. Pt requesting information on how to attempt increased pelvic floor strengthening without quick flicks, increased reps or holds as these are triggers for his condition. Pt attempted quick blinking for demonstration of difficulty and had an instance of slurred speech, decreased tone appearing in Lt eye but no LOB sitting on ball and  able to verblized "I'm ok" and "give me a second". Pt returned to normal speech and tone and denied any changes in vision, sensation, or mobility. Pt reported I do this any time I try to do something quickly over and over again, this is due to his genetic disorder,  per pt. Pt declined any additional needs or assistance appeared in no distress. Pt had a few questions about exercises and recommendations for pelvic strengthenig and PT continues to recommendation to attempt kegel with a task instead of multiple reps to decrease risk of these instances occuring and pt reports he understands and knows what triggers this. Pt continues to demonstrate need of PT to improve fecal incontinence and pelvic floor strengthening. Pt also asked about things can assist in firming/bulking stool and pt educated on fiber types and to ask MD about fiber supplements.    Personal Factors and Comorbidities Time since onset of injury/illness/exacerbation;Comorbidity 3+    Comorbidities hyperkalemic periodic paralysis with paramyotonia congenita, history of eosinophilic esophagitis and previous radiation induced proctitis/telangiectasia and bowel habit changes and a history of sclerosing mesenteritis, Diverticulosis    Examination-Activity Limitations Locomotion Level;Transfers;Stand;Hygiene/Grooming;Continence;Toileting    Examination-Participation Restrictions Community Activity;Interpersonal Relationship;Yard Work;Shop    Stability/Clinical Decision Making Unstable/Unpredictable    Rehab Potential Fair    PT Frequency 1x / week    PT Duration Other (comment)    PT Treatment/Interventions ADLs/Self Care Home Management;Functional mobility training;Therapeutic activities;Therapeutic exercise;Neuromuscular re-education;Aquatic Therapy;Manual techniques;Patient/family education;Scar mobilization;Passive range of motion;Energy conservation;Taping    PT Next Visit Plan relacxing techniques and gentle contractions    Consulted and Agree with Plan of Care Patient             Patient will benefit from skilled therapeutic intervention in order to improve the following deficits and impairments:  Decreased endurance, Decreased coordination, Difficulty walking, Increased fascial restricitons,  Impaired tone, Increased muscle spasms, Decreased activity tolerance, Decreased mobility, Decreased strength, Postural dysfunction, Impaired sensation, Improper body mechanics, Impaired flexibility, Pain, Decreased safety awareness  Visit Diagnosis: Muscle weakness (generalized)  Abnormality of gait and mobility  Lack of coordination     Problem List Patient Active Problem List   Diagnosis Date Noted   Chronic colitis after radiation tx 08/67/6195   Eosinophilic esophagitis 09/32/6712   Hypokalemic familial periodic paralysis 07/06/2020   Radiation enterocolitis 07/06/2020   Sclerosing mesenteritis ([place]) 07/06/2020    Stacy Gardner, PT, DPT 12/20/223:58 PM   [facility] @ [place] [place], [place], [address] Phone: [phone]   Fax:  [phone]  Name: Keith Walsh MRN: [id number] Date of Birth: 24-Apr-1950

## 2021-01-17 ENCOUNTER — Other Ambulatory Visit: Payer: Self-pay

## 2021-01-17 ENCOUNTER — Ambulatory Visit: Payer: PPO | Attending: Internal Medicine | Admitting: Physical Therapy

## 2021-01-17 DIAGNOSIS — R269 Unspecified abnormalities of gait and mobility: Secondary | ICD-10-CM | POA: Diagnosis not present

## 2021-01-17 DIAGNOSIS — M6281 Muscle weakness (generalized): Secondary | ICD-10-CM | POA: Diagnosis not present

## 2021-01-17 NOTE — Therapy (Signed)
[facility] @ [place], [place], [address] Phone: [phone]   Fax:  [phone]  Physical Therapy Treatment  Patient Details  Name: Keith Walsh MRN: [id number] Date of Birth: 08-03-1950 Referring Provider (PT): Gatha Mayer, MD   Encounter Date: 01/17/2021   PT End of Session - 01/17/21 1616     Visit Number 6    Date for PT Re-Evaluation 02/16/21    Authorization Type [employer]    PT Start Time 6060    PT Stop Time 1610    PT Time Calculation (min) 39 min    Activity Tolerance Patient tolerated treatment well;Patient limited by fatigue    Behavior During Therapy [facility] for tasks assessed/performed             Past Medical History:  Diagnosis Date   Avascular necrosis of femoral head ([place])    Cataract    Colitis    Eosinophilic esophagitis 0459   Erectile dysfunction following radiation therapy    Hyperkalemic periodic paralysis    Hypertension    Narcolepsy    Neurogenic bladder disorder    Prostate cancer ([place])    prostate   Sclerosing mesenteritis ([place])    Testicular cancer ([place])     Past Surgical History:  Procedure Laterality Date   CATARACT EXTRACTION Bilateral    COLONOSCOPY  2014   hyperplastic   ESOPHAGOGASTRODUODENOSCOPY     HERNIA REPAIR     LIPOMA EXCISION     ORCHIECTOMY  2014   PROSTATECTOMY  2008   RETINAL TEAR REPAIR CRYOTHERAPY Left    TONSILLECTOMY     TOTAL HIP ARTHROPLASTY Bilateral 2018   TOTAL HIP ARTHROPLASTY Right 2019   UPPER GASTROINTESTINAL ENDOSCOPY  2016   VASECTOMY      There were no vitals filed for this visit.   Subjective Assessment - 01/17/21 1535     Subjective Pt reports he has been doing "ok" but did have one instance of total loss of bowels which he hasn't had in several weeks since starting PT. Pt reports he has had a rough day today and not walking well and feels very fatigued.    Pertinent History hyperkalemic periodic  paralysis with paramyotonia congenita which limits mobility and walking or even kegels will cause muscle weakening and or paralysis worsening with inceased level of activity, also if too cold can cause pt to go spastic; bil hip replacement; history of eosinophilic esophagitis and previous radiation induced proctitis/telangiectasia and bowel habit changes and a history of sclerosing mesenteritis, Diverticulosis    Limitations Standing;Walking;House hold activities    How long can you sit comfortably? no limits    How long can you stand comfortably? this has gotten better to now around 40 mins-hour but notices some swelling and very tiring    How long can you walk comfortably? 20 mins but exhausting    Patient Stated Goals to have less fecal incontinence    Currently in Pain? No/denies                               [facility] Adult PT Treatment/Exercise - 01/17/21 0001       Self-Care   Self-Care Other Self-Care Comments    Other Self-Care Comments  pt educated on continued POC, home mobility with diet modifications, voiding mechanics, skin care, and HEP/returning to exercises. pelvic contractions with functional activity.  PT Education - 01/17/21 1615     Education Details Pt educated on HEP, pelvic contractions with functional tasks, and POC    Person(s) Educated Patient    Methods Explanation;Demonstration;Tactile cues;Verbal cues    Comprehension Returned demonstration;Verbalized understanding              PT Short Term Goals - 11/16/20 1503       PT SHORT TERM GOAL #1   Title pt to be I with HEP    Time 6    Period Weeks    Status New    Target Date 12/28/20      PT SHORT TERM GOAL #2   Title pt to demonstrate at least 3/5 pelvic floor strength to improve ability to prevent stool leakage    Time 6    Period Weeks    Status New    Target Date 12/28/20      PT SHORT TERM GOAL #3   Title pt to report no more than x4 BMs  per day to improve consistency and regularity of bowel movements and on average type 4    Time 6    Period Weeks    Status New    Target Date 12/28/20               PT Long Term Goals - 11/16/20 1504       PT LONG TERM GOAL #1   Title Pt to be I with advanced HEP    Time 3    Period Months    Status New    Target Date 02/16/21      PT LONG TERM GOAL #2   Title pt to demonstrate at least 4/5 pelvic floor strength to improve ability to prevent stool leakage    Time 3    Period Months    Status New    Target Date 02/16/21      PT LONG TERM GOAL #3   Title pt to report no more than x2 BMs per day to improve consistency and regularity of bowel movements and on average type 3-4    Time 3    Period Months    Status New    Target Date 02/16/21      PT LONG TERM GOAL #4   Title pt to demonstrate improved coordination of breathing mechanics and pelvic floor contract/relax during activities at least 75% of the time to improve ability pass stool effectively.    Time 3    Period Months    Status New    Target Date 02/16/21      PT LONG TERM GOAL #5   Title pt to demonstrate ability to hold isometric contraction of pelvic floor for at least 30 seconds at rectum to decrease leakage.    Time 3    Period Months    Status New    Target Date 02/16/21                   Plan - 01/17/21 1616     Clinical Impression Statement Pt presents to clinic reporting he has felt better in general, but has had bad couple of days with one full loss of bowels and change in meds since this past week. Pt reports other than this no loss of bowels and very limited smearing. Pt reports he has had a bad day today unsure what triggered it but very fatigued and having stiffness limiting gait, requests to educate on HEP and progress at home  and return in ~3 weeks for check in. Pt educated on POC, HEP, and pelvic contractions with functional tasks, denied questions and tolerated well with sitting  during session. Pt continues to need PT to insure carry over in education components of care, improve pelvic strength and endurance to decrease fecal leakage. Pt also re-educated to see a neurologist and pt agreed.    Personal Factors and Comorbidities Time since onset of injury/illness/exacerbation;Comorbidity 3+    Comorbidities hyperkalemic periodic paralysis with paramyotonia congenita, history of eosinophilic esophagitis and previous radiation induced proctitis/telangiectasia and bowel habit changes and a history of sclerosing mesenteritis, Diverticulosis    Examination-Activity Limitations Locomotion Level;Transfers;Stand;Hygiene/Grooming;Continence;Toileting    Examination-Participation Restrictions Community Activity;Interpersonal Relationship;Yard Work;Shop    Stability/Clinical Decision Making Unstable/Unpredictable    Rehab Potential Fair    PT Frequency 1x / week    PT Duration Other (comment)    PT Treatment/Interventions ADLs/Self Care Home Management;Functional mobility training;Therapeutic activities;Therapeutic exercise;Neuromuscular re-education;Aquatic Therapy;Manual techniques;Patient/family education;Scar mobilization;Passive range of motion;Energy conservation;Taping    PT Next Visit Plan relacxing techniques and gentle contractions    Consulted and Agree with Plan of Care Patient             Patient will benefit from skilled therapeutic intervention in order to improve the following deficits and impairments:  Decreased endurance, Decreased coordination, Difficulty walking, Increased fascial restricitons, Impaired tone, Increased muscle spasms, Decreased activity tolerance, Decreased mobility, Decreased strength, Postural dysfunction, Impaired sensation, Improper body mechanics, Impaired flexibility, Pain, Decreased safety awareness  Visit Diagnosis: Muscle weakness (generalized)  Abnormality of gait and mobility     Problem List Patient Active Problem List    Diagnosis Date Noted   Chronic colitis after radiation tx 32/95/1884   Eosinophilic esophagitis 16/60/6301   Hypokalemic familial periodic paralysis 07/06/2020   Radiation enterocolitis 07/06/2020   Sclerosing mesenteritis ([place]) 07/06/2020    Stacy Gardner, PT, DPT 01/03/234:39 PM   [facility] @ [address] [place], [place], [address] Phone: [phone]   Fax:  [phone]  Name: Keith Walsh MRN: [id number] Date of Birth: [birth year]

## 2021-02-14 ENCOUNTER — Other Ambulatory Visit: Payer: Self-pay

## 2021-02-14 ENCOUNTER — Encounter: Payer: Self-pay | Admitting: Physical Therapy

## 2021-02-14 ENCOUNTER — Ambulatory Visit: Payer: PPO | Admitting: Physical Therapy

## 2021-02-14 DIAGNOSIS — R269 Unspecified abnormalities of gait and mobility: Secondary | ICD-10-CM

## 2021-02-14 DIAGNOSIS — M6281 Muscle weakness (generalized): Secondary | ICD-10-CM

## 2021-02-14 NOTE — Therapy (Signed)
[facility] @ [place], [place], [address] Phone: [phone]   Fax:  [phone]  Physical Therapy Treatment  Patient Details  Name: Keith Walsh MRN: [id number] Date of Birth: 1950/09/14 Referring Provider (PT): Gatha Mayer, MD   Encounter Date: 02/14/2021   PT End of Session - 02/14/21 1102     Visit Number 7    Date for PT Re-Evaluation 02/16/21    Authorization Type [employer]    PT Start Time 1020   arrival time   PT Stop Time 1100    PT Time Calculation (min) 40 min    Activity Tolerance Patient tolerated treatment well;Patient limited by fatigue;Patient limited by pain    Behavior During Therapy [facility] for tasks assessed/performed             Past Medical History:  Diagnosis Date   Avascular necrosis of femoral head ([place])    Cataract    Colitis    Eosinophilic esophagitis 0071   Erectile dysfunction following radiation therapy    Hyperkalemic periodic paralysis    Hypertension    Narcolepsy    Neurogenic bladder disorder    Prostate cancer ([place])    prostate   Sclerosing mesenteritis ([place])    Testicular cancer ([place])     Past Surgical History:  Procedure Laterality Date   CATARACT EXTRACTION Bilateral    COLONOSCOPY  2014   hyperplastic   ESOPHAGOGASTRODUODENOSCOPY     HERNIA REPAIR     LIPOMA EXCISION     ORCHIECTOMY  2014   PROSTATECTOMY  2008   RETINAL TEAR REPAIR CRYOTHERAPY Left    TONSILLECTOMY     TOTAL HIP ARTHROPLASTY Bilateral 2018   TOTAL HIP ARTHROPLASTY Right 2019   UPPER GASTROINTESTINAL ENDOSCOPY  2016   VASECTOMY      There were no vitals filed for this visit.   Subjective Assessment - 02/14/21 1022     Subjective Pt reports she has been having an IBS flare for the past 3 days and not feeling well, has had 4 BMs this morning already and feels fatigued with abdominal pain with this as well. Pt reports during session, he has had vision changes in Lt  eye with dark spots reported and he isn't sure if it's gotten but reports he has noticed it about 2 months ago. Pt reports he is seeing an ophthalmologist next week at his soonest appointment and also has an appointment with an endocrinologist.    Pertinent History hyperkalemic periodic paralysis with paramyotonia congenita which limits mobility and walking or even kegels will cause muscle weakening and or paralysis worsening with inceased level of activity, also if too cold can cause pt to go spastic; bil hip replacement; history of eosinophilic esophagitis and previous radiation induced proctitis/telangiectasia and bowel habit changes and a history of sclerosing mesenteritis, Diverticulosis    Limitations Standing;Walking;House hold activities    How long can you sit comfortably? no limits    Patient Stated Goals to have less fecal incontinence    Currently in Pain? Yes    Pain Location Abdomen    Pain Orientation Lower    Pain Descriptors / Indicators Cramping    Pain Type Acute pain                               [facility] Adult PT Treatment/Exercise - 02/14/21 0001       Self-Care  Self-Care Other Self-Care Comments    Other Self-Care Comments  Pt educated and recommended to follow up for additional medical needs based on his MD's recommendation about current deficits and to keep appts with endocrinologist and opthamologist and to see about a neurologist due to his chronic condition. Pt agreed and agreed to DC this date due to his medical needs and needs of further assessments outside of PT at this time.                     PT Education - 02/14/21 1101     Education Details Pt educated to see additional medical follow ups as he has scheduled and discuss with MD about referral for neurologist. Pt agreed and understands.    Person(s) Educated Patient    Methods Explanation;Demonstration;Tactile cues;Verbal cues    Comprehension Returned  demonstration;Verbalized understanding              PT Short Term Goals - 02/14/21 1102       PT SHORT TERM GOAL #1   Title pt to be I with HEP    Time 6    Period Weeks    Status Achieved    Target Date 12/28/20      PT SHORT TERM GOAL #2   Title pt to demonstrate at least 3/5 pelvic floor strength to improve ability to prevent stool leakage    Time 6    Period Weeks    Status Unable to assess   pt denied due to now feeling well   Target Date 12/28/20      PT SHORT TERM GOAL #3   Title pt to report no more than x4 BMs per day to improve consistency and regularity of bowel movements and on average type 4    Time 6    Period Weeks    Status Not Met    Target Date 12/28/20               PT Long Term Goals - 02/14/21 1103       PT LONG TERM GOAL #1   Title Pt to be I with advanced HEP    Time 3    Period Months    Status Achieved    Target Date 02/16/21      PT LONG TERM GOAL #2   Title pt to demonstrate at least 4/5 pelvic floor strength to improve ability to prevent stool leakage    Time 3    Period Months    Status Unable to assess   pt denied due to not feeling well   Target Date 02/16/21      PT LONG TERM GOAL #3   Title pt to report no more than x2 BMs per day to improve consistency and regularity of bowel movements and on average type 3-4    Time 3    Period Months    Status Not Met    Target Date 02/16/21      PT LONG TERM GOAL #4   Title pt to demonstrate improved coordination of breathing mechanics and pelvic floor contract/relax during activities at least 75% of the time to improve ability pass stool effectively.    Time 3    Period Months    Status Partially Met    Target Date 02/16/21      PT LONG TERM GOAL #5   Title pt to demonstrate ability to hold isometric contraction of pelvic floor for at least 30 seconds at  rectum to decrease leakage.    Time 3    Period Months    Status Not Met   severely limited due to chronic disease  limiting his endurance   Target Date 02/16/21                   Plan - 02/14/21 1102     Personal Factors and Comorbidities Time since onset of injury/illness/exacerbation;Comorbidity 3+    Comorbidities hyperkalemic periodic paralysis with paramyotonia congenita, history of eosinophilic esophagitis and previous radiation induced proctitis/telangiectasia and bowel habit changes and a history of sclerosing mesenteritis, Diverticulosis    Examination-Activity Limitations Locomotion Level;Transfers;Stand;Hygiene/Grooming;Continence;Toileting    Examination-Participation Restrictions Community Activity;Interpersonal Relationship;Yard Work;Shop    Stability/Clinical Decision Making Unstable/Unpredictable    Rehab Potential Fair    PT Frequency 1x / week    PT Duration Other (comment)    PT Treatment/Interventions ADLs/Self Care Home Management;Functional mobility training;Therapeutic activities;Therapeutic exercise;Neuromuscular re-education;Aquatic Therapy;Manual techniques;Patient/family education;Scar mobilization;Passive range of motion;Energy conservation;Taping    PT Next Visit Plan relacxing techniques and gentle contractions    Consulted and Agree with Plan of Care Patient             Patient will benefit from skilled therapeutic intervention in order to improve the following deficits and impairments:  Decreased endurance, Decreased coordination, Difficulty walking, Increased fascial restricitons, Impaired tone, Increased muscle spasms, Decreased activity tolerance, Decreased mobility, Decreased strength, Postural dysfunction, Impaired sensation, Improper body mechanics, Impaired flexibility, Pain, Decreased safety awareness  Visit Diagnosis: Muscle weakness (generalized)  Abnormality of gait and mobility     Problem List Patient Active Problem List   Diagnosis Date Noted   Chronic colitis after radiation tx 94/58/5929   Eosinophilic esophagitis 24/46/2863    Hypokalemic familial periodic paralysis 07/06/2020   Radiation enterocolitis 07/06/2020   Sclerosing mesenteritis ([place]) 07/06/2020    PHYSICAL THERAPY DISCHARGE SUMMARY  Visits from Start of Care: 7  Current functional level related to goals / functional outcomes: Pt does continue to have intermittent bowel leakage however report additional other medical concerns at this time and has multiple additional medical appointments to address these concerns and would like to return to PT if needed after having these further addressed.    Remaining deficits: Fecal leakage and urgency   Education / Equipment: HEP   Patient agrees to discharge. Patient goals were partially met. Patient is being discharged due to the patient's request.    Junie Panning, PT 02/14/2021, 11:04 AM  [facility] @ [place] [address], [place], [address] Phone: [phone]   Fax:  [phone]  Name: Keith Walsh MRN: [id number] Date of Birth: December 24, 1950

## 2021-02-24 DIAGNOSIS — H31092 Other chorioretinal scars, left eye: Secondary | ICD-10-CM | POA: Diagnosis not present

## 2021-02-24 DIAGNOSIS — H26493 Other secondary cataract, bilateral: Secondary | ICD-10-CM | POA: Diagnosis not present

## 2021-02-24 DIAGNOSIS — H43812 Vitreous degeneration, left eye: Secondary | ICD-10-CM | POA: Diagnosis not present

## 2021-02-24 DIAGNOSIS — H18593 Other hereditary corneal dystrophies, bilateral: Secondary | ICD-10-CM | POA: Diagnosis not present

## 2021-03-08 ENCOUNTER — Encounter: Payer: PPO | Admitting: Physical Therapy

## 2021-03-17 DIAGNOSIS — H26493 Other secondary cataract, bilateral: Secondary | ICD-10-CM | POA: Diagnosis not present

## 2021-03-28 ENCOUNTER — Encounter: Payer: PPO | Admitting: Physical Therapy

## 2021-04-11 DIAGNOSIS — Z9852 Vasectomy status: Secondary | ICD-10-CM | POA: Diagnosis not present

## 2021-04-11 DIAGNOSIS — Z923 Personal history of irradiation: Secondary | ICD-10-CM | POA: Diagnosis not present

## 2021-04-11 DIAGNOSIS — E349 Endocrine disorder, unspecified: Secondary | ICD-10-CM | POA: Diagnosis not present

## 2021-04-11 DIAGNOSIS — R891 Abnormal level of hormones in specimens from other organs, systems and tissues: Secondary | ICD-10-CM | POA: Diagnosis not present

## 2021-04-11 DIAGNOSIS — N529 Male erectile dysfunction, unspecified: Secondary | ICD-10-CM | POA: Diagnosis not present

## 2021-04-11 DIAGNOSIS — G723 Periodic paralysis: Secondary | ICD-10-CM | POA: Diagnosis not present

## 2021-04-11 DIAGNOSIS — Z8042 Family history of malignant neoplasm of prostate: Secondary | ICD-10-CM | POA: Diagnosis not present

## 2021-04-11 DIAGNOSIS — Z92241 Personal history of systemic steroid therapy: Secondary | ICD-10-CM | POA: Diagnosis not present

## 2021-04-11 DIAGNOSIS — R5383 Other fatigue: Secondary | ICD-10-CM | POA: Diagnosis not present

## 2021-04-11 DIAGNOSIS — R519 Headache, unspecified: Secondary | ICD-10-CM | POA: Diagnosis not present

## 2021-04-11 DIAGNOSIS — E237 Disorder of pituitary gland, unspecified: Secondary | ICD-10-CM | POA: Diagnosis not present

## 2021-04-12 ENCOUNTER — Other Ambulatory Visit: Payer: Self-pay | Admitting: Internal Medicine

## 2021-04-12 DIAGNOSIS — R519 Headache, unspecified: Secondary | ICD-10-CM

## 2021-04-12 DIAGNOSIS — R7989 Other specified abnormal findings of blood chemistry: Secondary | ICD-10-CM

## 2021-04-12 DIAGNOSIS — E237 Disorder of pituitary gland, unspecified: Secondary | ICD-10-CM

## 2021-04-12 DIAGNOSIS — E349 Endocrine disorder, unspecified: Secondary | ICD-10-CM

## 2021-05-02 DIAGNOSIS — N319 Neuromuscular dysfunction of bladder, unspecified: Secondary | ICD-10-CM | POA: Diagnosis not present

## 2021-05-02 DIAGNOSIS — C61 Malignant neoplasm of prostate: Secondary | ICD-10-CM | POA: Diagnosis not present

## 2021-05-05 ENCOUNTER — Ambulatory Visit
Admission: RE | Admit: 2021-05-05 | Discharge: 2021-05-05 | Disposition: A | Discharge status: Home / Self Care | Payer: PPO | Source: Ambulatory Visit | Attending: Internal Medicine | Admitting: Internal Medicine

## 2021-05-05 DIAGNOSIS — E237 Disorder of pituitary gland, unspecified: Secondary | ICD-10-CM

## 2021-05-05 DIAGNOSIS — Z8546 Personal history of malignant neoplasm of prostate: Secondary | ICD-10-CM | POA: Diagnosis not present

## 2021-05-05 DIAGNOSIS — R7989 Other specified abnormal findings of blood chemistry: Secondary | ICD-10-CM | POA: Diagnosis not present

## 2021-05-05 DIAGNOSIS — E349 Endocrine disorder, unspecified: Secondary | ICD-10-CM

## 2021-05-05 DIAGNOSIS — I771 Stricture of artery: Secondary | ICD-10-CM | POA: Diagnosis not present

## 2021-05-05 DIAGNOSIS — J3489 Other specified disorders of nose and nasal sinuses: Secondary | ICD-10-CM | POA: Diagnosis not present

## 2021-05-05 DIAGNOSIS — R519 Headache, unspecified: Secondary | ICD-10-CM

## 2021-05-05 MED ORDER — GADOBENATE DIMEGLUMINE 529 MG/ML IV SOLN
10.0000 mL | Freq: Once | INTRAVENOUS | Status: AC | PRN
  Administered 2021-05-05: 10 mL via INTRAVENOUS

## 2021-05-15 DIAGNOSIS — G47411 Narcolepsy with cataplexy: Secondary | ICD-10-CM | POA: Diagnosis not present

## 2021-05-15 DIAGNOSIS — Z9852 Vasectomy status: Secondary | ICD-10-CM | POA: Diagnosis not present

## 2021-05-15 DIAGNOSIS — Z923 Personal history of irradiation: Secondary | ICD-10-CM | POA: Diagnosis not present

## 2021-05-15 DIAGNOSIS — R7989 Other specified abnormal findings of blood chemistry: Secondary | ICD-10-CM | POA: Diagnosis not present

## 2021-05-24 DIAGNOSIS — D1801 Hemangioma of skin and subcutaneous tissue: Secondary | ICD-10-CM | POA: Diagnosis not present

## 2021-05-24 DIAGNOSIS — L814 Other melanin hyperpigmentation: Secondary | ICD-10-CM | POA: Diagnosis not present

## 2021-05-24 DIAGNOSIS — L218 Other seborrheic dermatitis: Secondary | ICD-10-CM | POA: Diagnosis not present

## 2021-05-24 DIAGNOSIS — L821 Other seborrheic keratosis: Secondary | ICD-10-CM | POA: Diagnosis not present

## 2021-06-14 DIAGNOSIS — Z8546 Personal history of malignant neoplasm of prostate: Secondary | ICD-10-CM | POA: Diagnosis not present

## 2021-06-14 DIAGNOSIS — G47411 Narcolepsy with cataplexy: Secondary | ICD-10-CM | POA: Diagnosis not present

## 2021-06-14 DIAGNOSIS — N529 Male erectile dysfunction, unspecified: Secondary | ICD-10-CM | POA: Diagnosis not present

## 2021-06-14 DIAGNOSIS — G459 Transient cerebral ischemic attack, unspecified: Secondary | ICD-10-CM | POA: Diagnosis not present

## 2021-06-14 DIAGNOSIS — N319 Neuromuscular dysfunction of bladder, unspecified: Secondary | ICD-10-CM | POA: Diagnosis not present

## 2021-06-14 DIAGNOSIS — K589 Irritable bowel syndrome without diarrhea: Secondary | ICD-10-CM | POA: Diagnosis not present

## 2021-06-14 DIAGNOSIS — Z0000 Encounter for general adult medical examination without abnormal findings: Secondary | ICD-10-CM | POA: Diagnosis not present

## 2021-06-14 DIAGNOSIS — Z1389 Encounter for screening for other disorder: Secondary | ICD-10-CM | POA: Diagnosis not present

## 2021-06-14 DIAGNOSIS — I700 Atherosclerosis of aorta: Secondary | ICD-10-CM | POA: Diagnosis not present

## 2021-06-14 DIAGNOSIS — I10 Essential (primary) hypertension: Secondary | ICD-10-CM | POA: Diagnosis not present

## 2021-06-14 DIAGNOSIS — G723 Periodic paralysis: Secondary | ICD-10-CM | POA: Diagnosis not present

## 2021-07-12 DIAGNOSIS — L309 Dermatitis, unspecified: Secondary | ICD-10-CM | POA: Diagnosis not present

## 2021-07-12 DIAGNOSIS — L304 Erythema intertrigo: Secondary | ICD-10-CM | POA: Diagnosis not present

## 2021-11-15 DIAGNOSIS — G47411 Narcolepsy with cataplexy: Secondary | ICD-10-CM | POA: Diagnosis not present

## 2021-12-04 DIAGNOSIS — I10 Essential (primary) hypertension: Secondary | ICD-10-CM | POA: Diagnosis not present

## 2021-12-04 DIAGNOSIS — R079 Chest pain, unspecified: Secondary | ICD-10-CM | POA: Diagnosis not present

## 2021-12-04 DIAGNOSIS — G47411 Narcolepsy with cataplexy: Secondary | ICD-10-CM | POA: Diagnosis not present

## 2021-12-04 DIAGNOSIS — G723 Periodic paralysis: Secondary | ICD-10-CM | POA: Diagnosis not present

## 2021-12-29 DIAGNOSIS — R0789 Other chest pain: Secondary | ICD-10-CM | POA: Diagnosis not present

## 2021-12-29 DIAGNOSIS — R002 Palpitations: Secondary | ICD-10-CM | POA: Diagnosis not present

## 2022-01-10 DIAGNOSIS — R0789 Other chest pain: Secondary | ICD-10-CM | POA: Diagnosis not present

## 2022-01-10 DIAGNOSIS — R002 Palpitations: Secondary | ICD-10-CM | POA: Diagnosis not present

## 2022-01-18 DIAGNOSIS — R002 Palpitations: Secondary | ICD-10-CM | POA: Diagnosis not present

## 2022-02-15 ENCOUNTER — Emergency Department (HOSPITAL_COMMUNITY): Payer: PPO

## 2022-02-15 ENCOUNTER — Emergency Department (HOSPITAL_COMMUNITY)
Admission: EM | Admit: 2022-02-15 | Discharge: 2022-02-15 | Disposition: A | Discharge status: Home / Self Care | Payer: PPO | Attending: Emergency Medicine | Admitting: Emergency Medicine

## 2022-02-15 ENCOUNTER — Other Ambulatory Visit: Payer: Self-pay

## 2022-02-15 DIAGNOSIS — Z96643 Presence of artificial hip joint, bilateral: Secondary | ICD-10-CM | POA: Insufficient documentation

## 2022-02-15 DIAGNOSIS — I10 Essential (primary) hypertension: Secondary | ICD-10-CM | POA: Insufficient documentation

## 2022-02-15 DIAGNOSIS — Z79899 Other long term (current) drug therapy: Secondary | ICD-10-CM | POA: Insufficient documentation

## 2022-02-15 DIAGNOSIS — M25552 Pain in left hip: Secondary | ICD-10-CM | POA: Diagnosis not present

## 2022-02-15 DIAGNOSIS — Z8546 Personal history of malignant neoplasm of prostate: Secondary | ICD-10-CM | POA: Insufficient documentation

## 2022-02-15 MED ORDER — NAPROXEN 500 MG PO TABS: 500.0000 mg | ORAL_TABLET | Freq: Two times a day (BID) | ORAL | 0 refills | Status: AC

## 2022-02-15 MED ORDER — LIDOCAINE 5 % EX PTCH
1.0000 | MEDICATED_PATCH | Freq: Once | CUTANEOUS | Status: DC
  Administered 2022-02-15: 1 via TRANSDERMAL
  Filled 2022-02-15: qty 1

## 2022-02-15 MED ORDER — KETOROLAC TROMETHAMINE 30 MG/ML IJ SOLN
15.0000 mg | Freq: Once | INTRAMUSCULAR | Status: AC
  Administered 2022-02-15: 15 mg via INTRAMUSCULAR
  Filled 2022-02-15: qty 1

## 2022-02-15 NOTE — ED Provider Triage Note (Signed)
Emergency Medicine Provider Triage Evaluation Note  Keith Walsh , a 72 y.o. male  was evaluated in triage.  Pt complains of left hip pain.  Hx of osteonecrosis from steroid use, has had prior replacement.  States worsening pain over the weekend.  No fall/trauma reported.  Ambulatory in triage.  Review of Systems  Positive: Left hip pain Negative: fever  Physical Exam  BP (!) 164/90 (BP Location: Right Arm)   Pulse 77   Temp 98 F (36.7 C)   Resp 15   SpO2 98%  Gen:   Awake, no distress   Resp:  Normal effort  MSK:   Moves extremities without difficulty  Other:  Ambulatory, gait steady but slow  Medical Decision Making  Medically screening exam initiated at 5:28 AM.  Appropriate orders placed.  Keith Walsh was informed that the remainder of the evaluation will be completed by another provider, this initial triage assessment does not replace that evaluation, and the importance of remaining in the ED until their evaluation is complete.  Left hip pain.  Hx osteonecrosis with prior THA.  Ambulatory in triage.  X-ray ordered.   Larene Pickett, PA-C 02/15/22 0530

## 2022-02-15 NOTE — ED Triage Notes (Signed)
Pt c/o left hip pain. Denies fall/injury. Ambulatory in triage.

## 2022-02-15 NOTE — ED Provider Notes (Signed)
[facility] Provider Note   CSN: [id number] Arrival date & time: 02/15/22  [id number]     History  Chief Complaint  Patient presents with   Hip Pain    L    Keith Walsh is a 72 y.o. male.  Keith Walsh is a 72 y.o. male with a history of hypertension, hyperkalemic periodic paralysis, narcolepsy, neurogenic bladder disorder, prostate cancer, testicular cancer s/p left orchiectomy, avascular necrosis of the femoral head s/p bilateral total hip replacements, who presents for pain of his left hip.  Reports pain has been worsening over the past 4 to 5 days.  He denies any fall, injury or trauma.  He has been ambulatory despite hip pain.  Reports pain initially present more on the lateral portion of the left hip but now present on the lateral and posterior portion.  He denies radiation of pain down the leg.  No new numbness or weakness.  Underlying history of neurogenic bladder but no new changes in incontinence or bowel/bladder control.  No saddle anesthesia.  He has not noted any overlying redness warmth or skin changes.  The history is provided by the patient and medical records.  Hip Pain       Home Medications Prior to Admission medications   Medication Sig Start Date End Date Taking? Authorizing Provider  acetaZOLAMIDE (DIAMOX) 250 MG tablet Take 250 mg by mouth 4 (four) times daily. 02/16/19   [provider]  amoxicillin (AMOXIL) 500 MG capsule Take 2,000 mg by mouth See admin instructions. Take 4 capsules (1000 mg) by mouth one hour prior to dental appointments Patient not taking: No sig reported    [provider]  aspirin EC 81 MG tablet Take 81 mg by mouth daily with breakfast.    [provider]  dicyclomine (BENTYL) 10 MG capsule Take 1 capsule (10 mg total) by mouth 4 (four) times daily -  before meals and at bedtime. 08/23/20   Gatha Mayer, MD  losartan (COZAAR) 50 MG tablet Take 50 mg by mouth daily.     [provider]  pantoprazole (PROTONIX) 20 MG tablet Take 1 tablet (20 mg total) by mouth daily before breakfast. Using to taper off PPI 10/28/20   Gatha Mayer, MD  pantoprazole (PROTONIX) 40 MG tablet Take 40 mg by mouth daily before breakfast. 02/16/19   [provider]  Polyethyl Glycol-Propyl Glycol (SYSTANE ULTRA) 0.4-0.3 % SOLN Place 1 drop into both eyes 5 (five) times daily as needed (dry eyes).    [provider]      Allergies    Other and Proparacaine    Review of Systems   Review of Systems  Constitutional:  Negative for chills and fever.  Musculoskeletal:  Positive for arthralgias. Negative for back pain.  Skin:  Negative for color change and rash.  Neurological:  Negative for weakness and numbness.    Physical Exam Updated Vital Signs BP (!) 164/90 (BP Location: Right Arm)   Pulse 77   Temp 98 F (36.7 C)   Resp 15   SpO2 98%  Physical Exam Vitals and nursing note reviewed.  Constitutional:      General: He is not in acute distress.    Appearance: Normal appearance. He is well-developed. He is not ill-appearing or diaphoretic.  HENT:     Head: Normocephalic and atraumatic.  Eyes:     General:        Right eye: No discharge.  Left eye: No discharge.  Cardiovascular:     Rate and Rhythm: Normal rate and regular rhythm.  Pulmonary:     Effort: Pulmonary effort is normal. No respiratory distress.  Abdominal:     General: Bowel sounds are normal.     Palpations: Abdomen is soft.     Tenderness: There is no abdominal tenderness. There is no right CVA tenderness or left CVA tenderness.  Musculoskeletal:     Comments: Tenderness present primarily over the lateral bony prominence of the left hip, no overlying erythema warmth or skin changes and no palpable bony deformity.  No tenderness over the posterior hip, there is mild anterior hip tenderness no palpable inguinal masses.  No tenderness at the knee or ankle, distal pulses  2+, full sensation and strength in the left lower extremity with some pain with resisted range of motion.  Skin:    General: Skin is warm and dry.  Neurological:     Mental Status: He is alert and oriented to person, place, and time.     Coordination: Coordination normal.  Psychiatric:        Mood and Affect: Mood normal.        Behavior: Behavior normal.     ED Results / Procedures / Treatments   Labs (all labs ordered are listed, but only abnormal results are displayed) Labs Reviewed - No data to display  EKG None  Radiology CT Hip Left Wo Contrast  Result Date: 02/15/2022 CLINICAL DATA:  Left hip pain. EXAM: CT OF THE LEFT HIP WITHOUT CONTRAST TECHNIQUE: Multidetector CT imaging of the left hip was performed according to the standard protocol. Multiplanar CT image reconstructions were also generated. RADIATION DOSE REDUCTION: This exam was performed according to the departmental dose-optimization program which includes automated exposure control, adjustment of the mA and/or kV according to patient size and/or use of iterative reconstruction technique. COMPARISON:  Radiographs, same date. FINDINGS: The left hip prosthesis is intact. No evidence of loosening periprosthetic fracture. The pubic symphysis and lower left SI joint are intact. The left hip and pelvic musculature are grossly normal. No obvious muscle tear, mass or intramuscular hematoma. No significant left-sided intrapelvic abnormalities. Scattered sigmoid colon diverticulosis. IMPRESSION: 1. Intact left hip prosthesis without evidence of loosening or periprosthetic fracture. 2. No significant soft tissue findings. Electronically Signed   By: Marijo Sanes M.D.   On: 02/15/2022 08:00   DG Hip Unilat W or Wo Pelvis 2-3 Views Left  Result Date: 02/15/2022 CLINICAL DATA:  Left hip pain. History of osteonecrosis and replacement EXAM: DG HIP (WITH OR WITHOUT PELVIS) 3V LEFT COMPARISON:  04/12/2019 CT FINDINGS: There is no evidence of  hip fracture or dislocation. Well seated bilateral total hip arthroplasty, partially covered on the right at the femoral stem. No evidence of bone lesion. Pelvic atheromatous calcification. IMPRESSION: Uncomplicated total hip arthroplasty on both sides. Electronically Signed   By: Jorje Guild M.D.   On: 02/15/2022 05:53     Procedures Procedures    Medications Ordered in ED Medications - No data to display  ED Course/ Medical Decision Making/ A&P                             Medical Decision Making Amount and/or Complexity of Data Reviewed Radiology: ordered.  Risk Prescription drug management.   72 year old male presents with hip pain, prior hip replacement due to AVN.  No trauma or injury.  Some tenderness over the  lateral and anterior hip without overlying skin changes.  Patient is able to range hip with some discomfort, but exam is not concerning for septic arthritis.  X-ray of the hip independently viewed and interpreted with uncomplicated total hip arthroplasty on both sides.  CT obtained to rule out occult periprosthetic fracture or hardware movement, this was reviewed and interpreted without acute abnormality.  Patient has not tried anything to treat this hip pain, pain improved in ED with Toradol.  Recommend continued course of NSAIDs as well as Lidoderm patch and follow-up with orthopedics.  Hip replacements done in [place] but patient has been followed by [facility] for other orthopedic issues since relocating to [facility]  He will follow-up with them.  Return precautions provided.  Discharged home in good condition.        Final Clinical Impression(s) / ED Diagnoses Final diagnoses:  Left hip pain    Rx / DC Orders ED Discharge Orders          Ordered    naproxen (NAPROSYN) 500 MG tablet  2 times daily        02/15/22 0829              Jacqlyn Larsen, PA-C [id number]    Lianne Cure, DO [id number]

## 2022-02-15 NOTE — Discharge Instructions (Signed)
Your x-ray and CT look good today, no evidence of problems with your hip replacement.  Suspect surrounding soft tissue inflammation.  Take anti-inflammatories twice daily for the next week.  If pain is not improving you should follow-up with orthopedics.  If you develop overlying redness, fevers or chills, persistent numbness or weakness in your leg or other new or concerning symptoms please return for reevaluation.

## 2022-02-21 DIAGNOSIS — M25552 Pain in left hip: Secondary | ICD-10-CM | POA: Diagnosis not present

## 2022-05-11 DIAGNOSIS — H31092 Other chorioretinal scars, left eye: Secondary | ICD-10-CM | POA: Diagnosis not present

## 2022-05-11 DIAGNOSIS — H43812 Vitreous degeneration, left eye: Secondary | ICD-10-CM | POA: Diagnosis not present

## 2022-05-11 DIAGNOSIS — H18593 Other hereditary corneal dystrophies, bilateral: Secondary | ICD-10-CM | POA: Diagnosis not present

## 2022-05-11 DIAGNOSIS — Z961 Presence of intraocular lens: Secondary | ICD-10-CM | POA: Diagnosis not present

## 2022-05-16 DIAGNOSIS — C61 Malignant neoplasm of prostate: Secondary | ICD-10-CM | POA: Diagnosis not present

## 2022-05-16 DIAGNOSIS — N319 Neuromuscular dysfunction of bladder, unspecified: Secondary | ICD-10-CM | POA: Diagnosis not present

## 2022-05-18 DIAGNOSIS — J40 Bronchitis, not specified as acute or chronic: Secondary | ICD-10-CM | POA: Diagnosis not present

## 2022-05-18 DIAGNOSIS — R0981 Nasal congestion: Secondary | ICD-10-CM | POA: Diagnosis not present

## 2022-05-23 DIAGNOSIS — G47411 Narcolepsy with cataplexy: Secondary | ICD-10-CM | POA: Diagnosis not present

## 2022-05-25 DIAGNOSIS — L814 Other melanin hyperpigmentation: Secondary | ICD-10-CM | POA: Diagnosis not present

## 2022-05-25 DIAGNOSIS — L821 Other seborrheic keratosis: Secondary | ICD-10-CM | POA: Diagnosis not present

## 2022-05-25 DIAGNOSIS — D225 Melanocytic nevi of trunk: Secondary | ICD-10-CM | POA: Diagnosis not present

## 2022-06-19 DIAGNOSIS — E785 Hyperlipidemia, unspecified: Secondary | ICD-10-CM | POA: Diagnosis not present

## 2022-06-19 DIAGNOSIS — R159 Full incontinence of feces: Secondary | ICD-10-CM | POA: Diagnosis not present

## 2022-06-19 DIAGNOSIS — Z8546 Personal history of malignant neoplasm of prostate: Secondary | ICD-10-CM | POA: Diagnosis not present

## 2022-06-19 DIAGNOSIS — G723 Periodic paralysis: Secondary | ICD-10-CM | POA: Diagnosis not present

## 2022-06-19 DIAGNOSIS — I10 Essential (primary) hypertension: Secondary | ICD-10-CM | POA: Diagnosis not present

## 2022-06-19 DIAGNOSIS — G459 Transient cerebral ischemic attack, unspecified: Secondary | ICD-10-CM | POA: Diagnosis not present

## 2022-06-19 DIAGNOSIS — N319 Neuromuscular dysfunction of bladder, unspecified: Secondary | ICD-10-CM | POA: Diagnosis not present

## 2022-06-19 DIAGNOSIS — G47411 Narcolepsy with cataplexy: Secondary | ICD-10-CM | POA: Diagnosis not present

## 2022-06-19 DIAGNOSIS — I700 Atherosclerosis of aorta: Secondary | ICD-10-CM | POA: Diagnosis not present

## 2022-06-19 DIAGNOSIS — Z0000 Encounter for general adult medical examination without abnormal findings: Secondary | ICD-10-CM | POA: Diagnosis not present

## 2022-06-19 DIAGNOSIS — Z1389 Encounter for screening for other disorder: Secondary | ICD-10-CM | POA: Diagnosis not present

## 2022-08-09 ENCOUNTER — Other Ambulatory Visit (HOSPITAL_COMMUNITY): Payer: Self-pay

## 2022-08-09 MED ORDER — LOSARTAN POTASSIUM 50 MG PO TABS
50.0000 mg | ORAL_TABLET | Freq: Every day | ORAL | 4 refills | Status: AC
  Filled 2022-08-09: qty 90, 90d supply, fill #0
  Filled 2022-11-12: qty 90, 90d supply, fill #1
  Filled 2023-02-13: qty 90, 90d supply, fill #2
  Filled 2023-05-29: qty 90, 90d supply, fill #3

## 2022-08-09 MED ORDER — ACETAZOLAMIDE 250 MG PO TABS
250.0000 mg | ORAL_TABLET | Freq: Four times a day (QID) | ORAL | 4 refills | Status: AC
  Filled 2022-08-09: qty 360, 90d supply, fill #0
  Filled 2022-11-12: qty 360, 90d supply, fill #1
  Filled 2023-02-13: qty 360, 90d supply, fill #2
  Filled 2023-05-29: qty 360, 90d supply, fill #3

## 2022-08-13 ENCOUNTER — Other Ambulatory Visit (HOSPITAL_COMMUNITY): Payer: Self-pay

## 2022-08-13 DIAGNOSIS — H31092 Other chorioretinal scars, left eye: Secondary | ICD-10-CM | POA: Diagnosis not present

## 2022-08-13 DIAGNOSIS — Z961 Presence of intraocular lens: Secondary | ICD-10-CM | POA: Diagnosis not present

## 2022-08-13 DIAGNOSIS — H43813 Vitreous degeneration, bilateral: Secondary | ICD-10-CM | POA: Diagnosis not present

## 2022-11-13 ENCOUNTER — Other Ambulatory Visit (HOSPITAL_COMMUNITY): Payer: Self-pay

## 2022-11-15 ENCOUNTER — Other Ambulatory Visit (HOSPITAL_COMMUNITY): Payer: Self-pay

## 2022-11-21 ENCOUNTER — Other Ambulatory Visit: Payer: Self-pay

## 2022-11-21 ENCOUNTER — Other Ambulatory Visit (HOSPITAL_COMMUNITY): Payer: Self-pay

## 2022-11-21 DIAGNOSIS — G47411 Narcolepsy with cataplexy: Secondary | ICD-10-CM | POA: Diagnosis not present

## 2022-11-21 MED ORDER — MODAFINIL 100 MG PO TABS
100.0000 mg | ORAL_TABLET | Freq: Every morning | ORAL | 2 refills | Status: AC
  Filled 2022-11-21: qty 30, 30d supply, fill #0

## 2022-11-26 ENCOUNTER — Other Ambulatory Visit (HOSPITAL_COMMUNITY): Payer: Self-pay

## 2022-12-19 DIAGNOSIS — E237 Disorder of pituitary gland, unspecified: Secondary | ICD-10-CM | POA: Diagnosis not present

## 2022-12-19 DIAGNOSIS — Z8546 Personal history of malignant neoplasm of prostate: Secondary | ICD-10-CM | POA: Diagnosis not present

## 2022-12-19 DIAGNOSIS — G459 Transient cerebral ischemic attack, unspecified: Secondary | ICD-10-CM | POA: Diagnosis not present

## 2022-12-19 DIAGNOSIS — I10 Essential (primary) hypertension: Secondary | ICD-10-CM | POA: Diagnosis not present

## 2022-12-19 DIAGNOSIS — G47411 Narcolepsy with cataplexy: Secondary | ICD-10-CM | POA: Diagnosis not present

## 2022-12-19 DIAGNOSIS — I700 Atherosclerosis of aorta: Secondary | ICD-10-CM | POA: Diagnosis not present

## 2022-12-19 DIAGNOSIS — G723 Periodic paralysis: Secondary | ICD-10-CM | POA: Diagnosis not present

## 2023-02-13 ENCOUNTER — Other Ambulatory Visit (HOSPITAL_COMMUNITY): Payer: Self-pay

## 2023-02-14 ENCOUNTER — Other Ambulatory Visit (HOSPITAL_COMMUNITY): Payer: Self-pay

## 2023-05-29 ENCOUNTER — Other Ambulatory Visit (HOSPITAL_COMMUNITY): Payer: Self-pay

## 2023-06-05 DIAGNOSIS — C61 Malignant neoplasm of prostate: Secondary | ICD-10-CM | POA: Diagnosis not present

## 2023-06-11 DIAGNOSIS — G47411 Narcolepsy with cataplexy: Secondary | ICD-10-CM | POA: Diagnosis not present

## 2023-06-11 DIAGNOSIS — D225 Melanocytic nevi of trunk: Secondary | ICD-10-CM | POA: Diagnosis not present

## 2023-06-11 DIAGNOSIS — G723 Periodic paralysis: Secondary | ICD-10-CM | POA: Diagnosis not present

## 2023-06-11 DIAGNOSIS — L814 Other melanin hyperpigmentation: Secondary | ICD-10-CM | POA: Diagnosis not present

## 2023-06-11 DIAGNOSIS — G4700 Insomnia, unspecified: Secondary | ICD-10-CM | POA: Diagnosis not present

## 2023-06-11 DIAGNOSIS — L821 Other seborrheic keratosis: Secondary | ICD-10-CM | POA: Diagnosis not present

## 2023-06-20 ENCOUNTER — Other Ambulatory Visit (HOSPITAL_COMMUNITY): Payer: Self-pay

## 2023-06-20 DIAGNOSIS — Z0000 Encounter for general adult medical examination without abnormal findings: Secondary | ICD-10-CM | POA: Diagnosis not present

## 2023-06-20 DIAGNOSIS — E785 Hyperlipidemia, unspecified: Secondary | ICD-10-CM | POA: Diagnosis not present

## 2023-06-20 DIAGNOSIS — Z8546 Personal history of malignant neoplasm of prostate: Secondary | ICD-10-CM | POA: Diagnosis not present

## 2023-06-20 DIAGNOSIS — Z1389 Encounter for screening for other disorder: Secondary | ICD-10-CM | POA: Diagnosis not present

## 2023-06-20 DIAGNOSIS — E237 Disorder of pituitary gland, unspecified: Secondary | ICD-10-CM | POA: Diagnosis not present

## 2023-06-20 DIAGNOSIS — N529 Male erectile dysfunction, unspecified: Secondary | ICD-10-CM | POA: Diagnosis not present

## 2023-06-20 DIAGNOSIS — G459 Transient cerebral ischemic attack, unspecified: Secondary | ICD-10-CM | POA: Diagnosis not present

## 2023-06-20 DIAGNOSIS — G723 Periodic paralysis: Secondary | ICD-10-CM | POA: Diagnosis not present

## 2023-06-20 DIAGNOSIS — I700 Atherosclerosis of aorta: Secondary | ICD-10-CM | POA: Diagnosis not present

## 2023-06-20 DIAGNOSIS — G47411 Narcolepsy with cataplexy: Secondary | ICD-10-CM | POA: Diagnosis not present

## 2023-06-20 DIAGNOSIS — N319 Neuromuscular dysfunction of bladder, unspecified: Secondary | ICD-10-CM | POA: Diagnosis not present

## 2023-06-20 DIAGNOSIS — I10 Essential (primary) hypertension: Secondary | ICD-10-CM | POA: Diagnosis not present

## 2023-06-20 MED ORDER — ACETAZOLAMIDE 250 MG PO TABS
250.0000 mg | ORAL_TABLET | Freq: Four times a day (QID) | ORAL | 5 refills | Status: AC
  Filled 2023-06-20 – 2023-08-29 (×4): qty 360, 90d supply, fill #0
  Filled 2023-11-21: qty 360, 90d supply, fill #1
  Filled 2024-02-20: qty 360, 90d supply, fill #2

## 2023-06-20 MED ORDER — LOSARTAN POTASSIUM 50 MG PO TABS
50.0000 mg | ORAL_TABLET | Freq: Every day | ORAL | 5 refills | Status: AC
  Filled 2023-06-20 – 2023-08-29 (×4): qty 90, 90d supply, fill #0
  Filled 2023-11-21: qty 90, 90d supply, fill #1
  Filled 2024-02-20: qty 90, 90d supply, fill #2

## 2023-06-27 ENCOUNTER — Other Ambulatory Visit (HOSPITAL_COMMUNITY): Payer: Self-pay

## 2023-06-27 DIAGNOSIS — K029 Dental caries, unspecified: Secondary | ICD-10-CM | POA: Diagnosis not present

## 2023-06-27 MED ORDER — AMOXICILLIN 500 MG PO CAPS
500.0000 mg | ORAL_CAPSULE | Freq: Three times a day (TID) | ORAL | 0 refills | Status: AC
  Filled 2023-06-27: qty 21, 7d supply, fill #0

## 2023-07-24 IMAGING — MR MR HEAD WO/W CM
11 of 19 series · 34 of 48 positions shown · IV contrast (11 ml multihance)
Comparison: None.

CLINICAL DATA: 70-year-old male with elevated FSH. History of
prostate/testicular cancer.

EXAM:
MRI HEAD WITHOUT AND WITH CONTRAST
TECHNIQUE: Multiplanar, multiecho pulse sequences of the brain and surrounding
structures were obtained without and with intravenous contrast.
CONTRAST:  10mL MULTIHANCE GADOBENATE DIMEGLUMINE 529 MG/ML IV SOLN

[Series 2: T1 · sagittal · 5.0mm · 0.45mm/px · 3 of 23 slices shown (1 of 3)]
[im 1/23]
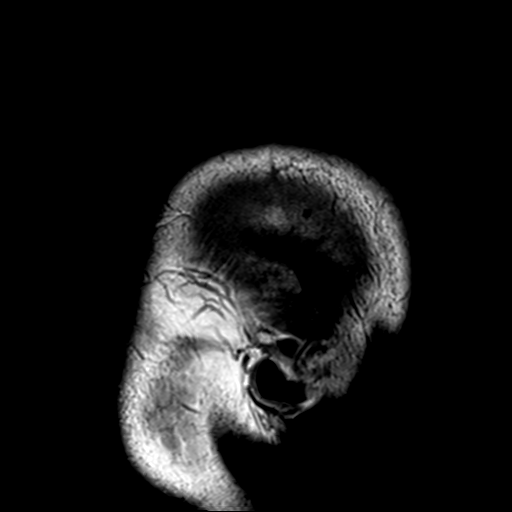
[im 12/23]
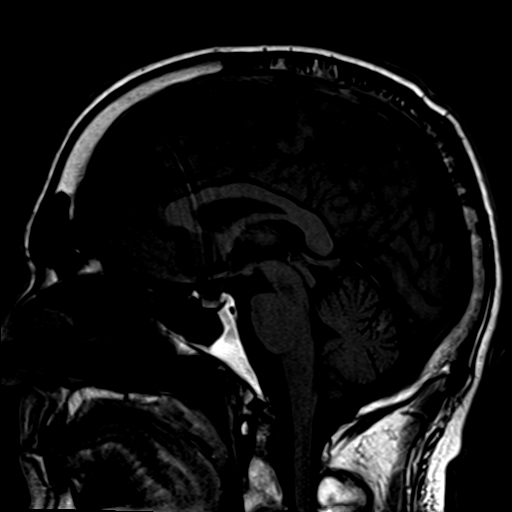
[im 23/23]
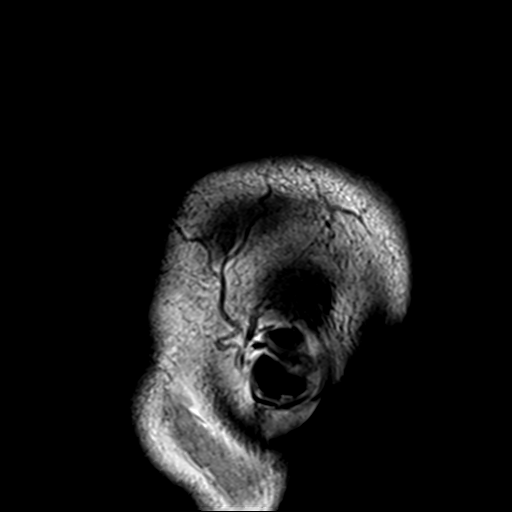

[Series 3: DWI · axial · 3.0mm · 1.80mm/px · z∈[-47,+112]mm · 11 of 110 slices shown]
[im 1/110]
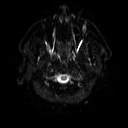
[im 11/110]
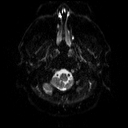
[im 22/110]
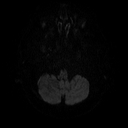
[im 33/110]
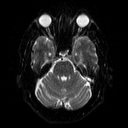
[im 44/110]
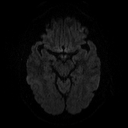
[im 55/110]
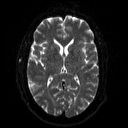
[im 66/110]
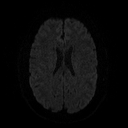
[im 77/110]
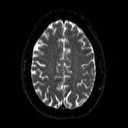
[im 88/110]
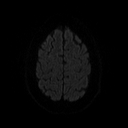
[im 99/110]
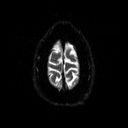
[im 110/110]
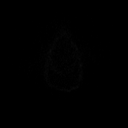

[Series 4: dwi_adc · axial · 3.0mm · 1.80mm/px · z∈[-47,+112]mm · 5 of 55 slices shown]
[im 1/55]
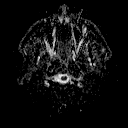
[im 14/55]
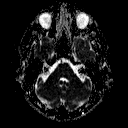
[im 28/55]
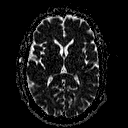
[im 41/55]
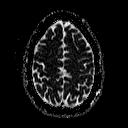
[im 55/55]
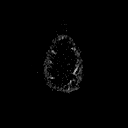

[Series 5: T2 · axial · 5.0mm · 0.36mm/px · z∈[-56,+105]mm · 3 of 26 slices shown (1 of 2)]
[im 1/26]
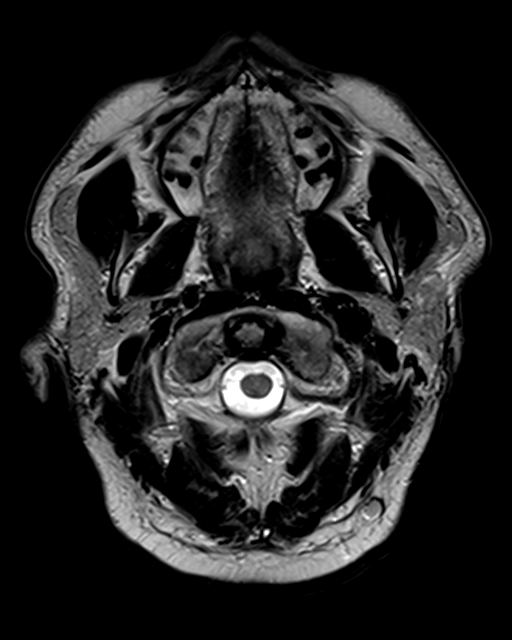
[im 13/26]
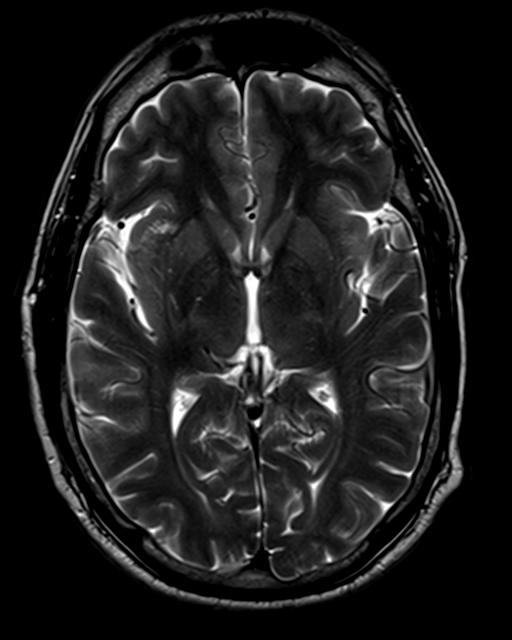
[im 26/26]
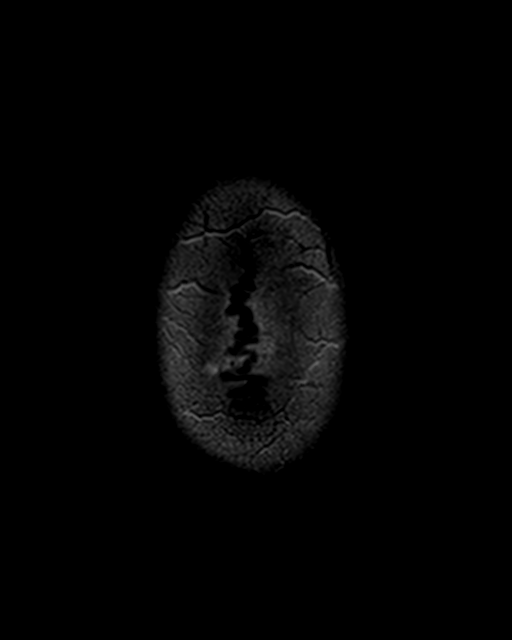

[Series 6: FLAIR · axial · 3.0mm · 0.45mm/px · z∈[-52,+100]mm · 3 of 34 slices shown]
[im 1/34]
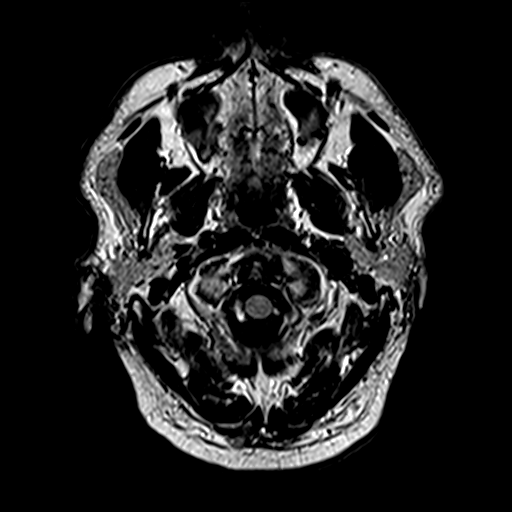
[im 17/34]
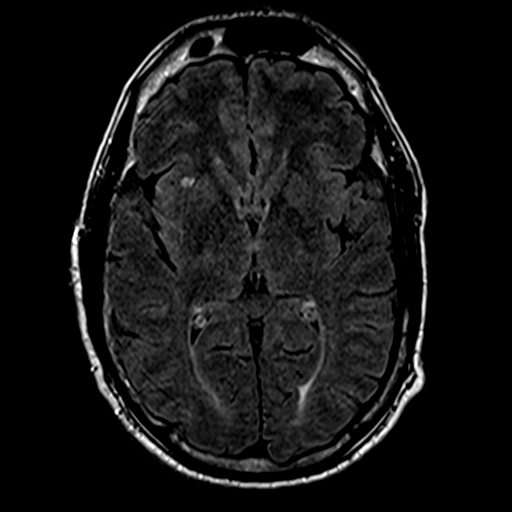
[im 34/34]
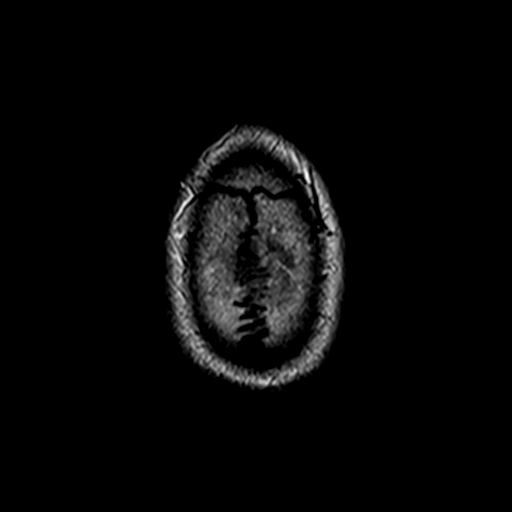

[Series 8: swi_images · axial · 4.0mm · 0.94mm/px · z∈[-53,+102]mm · 4 of 40 slices shown]
[im 1/40]
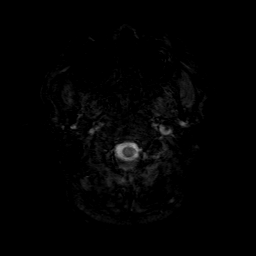
[im 14/40]
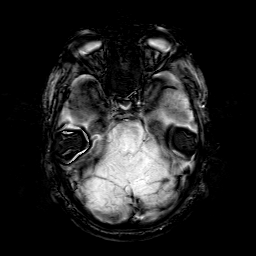
[im 27/40]
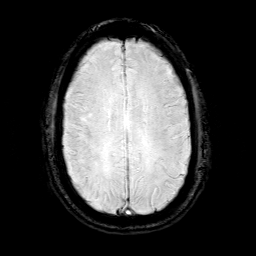
[im 40/40]
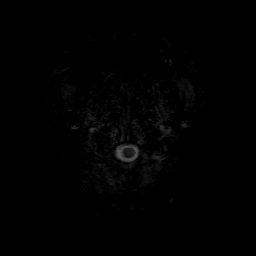

[Series 9: T1 · sagittal · 3.0mm · 0.33mm/px · 1 of 11 slices shown (2 of 3)]
[im 1/11]
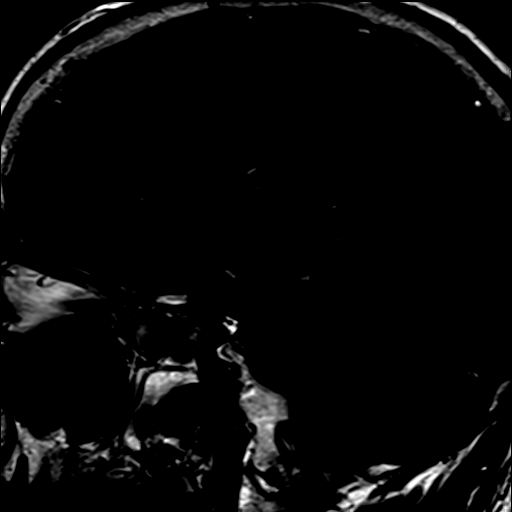

[Series 10: T1 · coronal · 3.0mm · 0.33mm/px · 1 of 12 slices shown (3 of 3)]
[im 1/12]
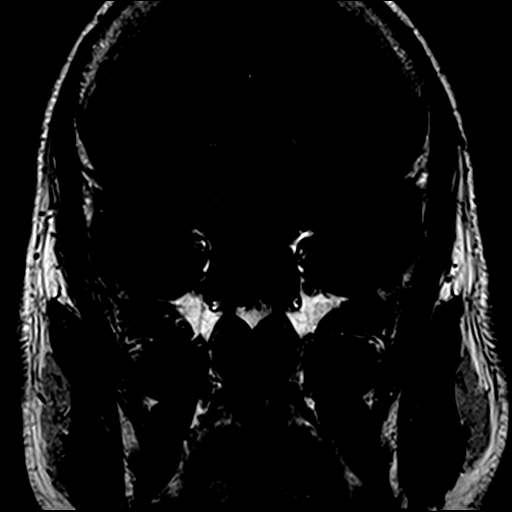

[Series 11: T2 · coronal · 3.0mm · 0.23mm/px · 1 of 12 slices shown (2 of 2)]
[im 1/12]
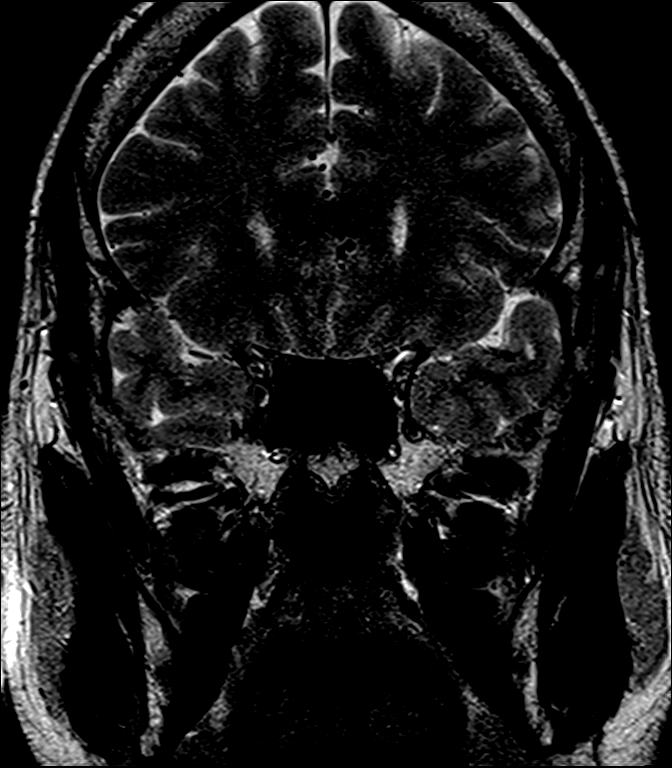

[Series 19: T1 post-contrast · sagittal · 3.0mm · 0.33mm/px · 1 of 11 slices shown (1 of 2)]
[im 1/11]
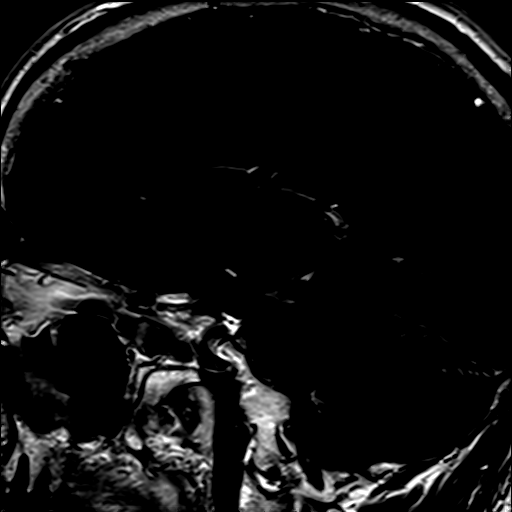

[Series 20: T1 post-contrast · coronal · 3.0mm · 0.33mm/px · 1 of 12 slices shown (2 of 2)]
[im 1/12]
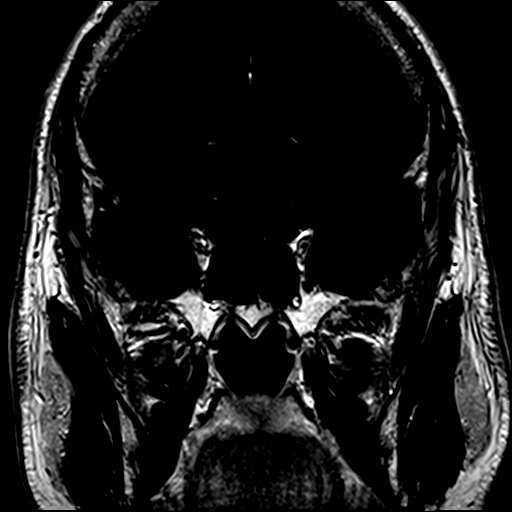

[34 of 48 positions shown; findings below may reference images not displayed]

FINDINGS: Brain: Dedicated pituitary imaging detailed below.

Cerebral volume is within normal limits for age. No restricted
diffusion to suggest acute infarction. No midline shift, mass
effect, evidence of mass lesion, ventriculomegaly, extra-axial
collection or acute intracranial hemorrhage. Cervicomedullary
junction is within normal limits.

Essentially normal for age gray and white matter signal throughout
the brain; minimal nonspecific white matter T2 and FLAIR
hyperintensity. No cortical encephalomalacia or chronic cerebral
blood products identified. No abnormal gray or white matter
enhancement.

Vascular: Major intracranial vascular flow voids are preserved.
Dominant left vertebral artery. Generalized intracranial artery
tortuosity.

Skull and upper cervical spine: Negative. Visualized bone marrow
signal is within normal limits.

Sinuses/Orbits: Postoperative changes to both globes. Well aerated
paranasal sinuses, minor maxillary sinus mucosal thickening greater
on the left.

Mastoid air cells are clear. Visible internal auditory structures
appear normal. Visible scalp and face within normal limits.

Other: Dedicated thin slice pre and postcontrast pituitary imaging.
Overall normal pituitary size and configuration. Normal
infundibulum. Normal suprasellar cistern. Normal hypothalamus.
Bilateral cavernous sinus within normal limits. On dynamic and
delayed post-contrast imaging the enhancement of the pituitary gland
is within normal limits. No discrete pituitary nodule or mass. No
abnormal enhancement identified.
IMPRESSION: 1. Normal MRI appearance of the pituitary gland.
2. Normal for age MRI appearance of the brain.

## 2023-08-12 ENCOUNTER — Other Ambulatory Visit (HOSPITAL_COMMUNITY): Payer: Self-pay

## 2023-08-12 DIAGNOSIS — M25512 Pain in left shoulder: Secondary | ICD-10-CM | POA: Diagnosis not present

## 2023-08-12 MED ORDER — MELOXICAM 15 MG PO TABS
15.0000 mg | ORAL_TABLET | Freq: Every day | ORAL | 5 refills | Status: AC
  Filled 2023-08-12: qty 30, 30d supply, fill #0

## 2023-08-15 DIAGNOSIS — H18593 Other hereditary corneal dystrophies, bilateral: Secondary | ICD-10-CM | POA: Diagnosis not present

## 2023-08-15 DIAGNOSIS — H43813 Vitreous degeneration, bilateral: Secondary | ICD-10-CM | POA: Diagnosis not present

## 2023-08-15 DIAGNOSIS — H31092 Other chorioretinal scars, left eye: Secondary | ICD-10-CM | POA: Diagnosis not present

## 2023-08-15 DIAGNOSIS — Z961 Presence of intraocular lens: Secondary | ICD-10-CM | POA: Diagnosis not present

## 2023-08-19 DIAGNOSIS — M25512 Pain in left shoulder: Secondary | ICD-10-CM | POA: Diagnosis not present

## 2023-08-28 ENCOUNTER — Other Ambulatory Visit (HOSPITAL_COMMUNITY): Payer: Self-pay

## 2023-08-29 ENCOUNTER — Other Ambulatory Visit: Payer: Self-pay

## 2023-08-29 ENCOUNTER — Other Ambulatory Visit (HOSPITAL_COMMUNITY): Payer: Self-pay

## 2023-08-30 ENCOUNTER — Other Ambulatory Visit (HOSPITAL_BASED_OUTPATIENT_CLINIC_OR_DEPARTMENT_OTHER): Payer: Self-pay

## 2023-08-30 ENCOUNTER — Other Ambulatory Visit (HOSPITAL_COMMUNITY): Payer: Self-pay

## 2023-09-10 DIAGNOSIS — G47411 Narcolepsy with cataplexy: Secondary | ICD-10-CM | POA: Diagnosis not present

## 2023-09-10 DIAGNOSIS — M7542 Impingement syndrome of left shoulder: Secondary | ICD-10-CM | POA: Diagnosis not present

## 2023-09-10 DIAGNOSIS — G723 Periodic paralysis: Secondary | ICD-10-CM | POA: Diagnosis not present

## 2023-09-17 DIAGNOSIS — M7542 Impingement syndrome of left shoulder: Secondary | ICD-10-CM | POA: Diagnosis not present

## 2023-09-24 DIAGNOSIS — M7542 Impingement syndrome of left shoulder: Secondary | ICD-10-CM | POA: Diagnosis not present

## 2023-10-08 DIAGNOSIS — M7542 Impingement syndrome of left shoulder: Secondary | ICD-10-CM | POA: Diagnosis not present

## 2023-10-14 ENCOUNTER — Other Ambulatory Visit (HOSPITAL_COMMUNITY): Payer: Self-pay

## 2023-10-14 DIAGNOSIS — M25512 Pain in left shoulder: Secondary | ICD-10-CM | POA: Diagnosis not present

## 2023-10-14 MED ORDER — MELOXICAM 15 MG PO TABS
15.0000 mg | ORAL_TABLET | Freq: Every day | ORAL | 0 refills | Status: AC
  Filled 2023-10-14: qty 10, 10d supply, fill #0

## 2023-10-17 ENCOUNTER — Other Ambulatory Visit (HOSPITAL_COMMUNITY): Payer: Self-pay | Admitting: Sports Medicine

## 2023-10-17 DIAGNOSIS — M25512 Pain in left shoulder: Secondary | ICD-10-CM

## 2023-10-22 ENCOUNTER — Ambulatory Visit (HOSPITAL_COMMUNITY)
Admission: RE | Admit: 2023-10-22 | Discharge: 2023-10-22 | Disposition: A | Discharge status: Home / Self Care | Source: Ambulatory Visit | Attending: Sports Medicine | Admitting: Sports Medicine

## 2023-10-22 DIAGNOSIS — M19012 Primary osteoarthritis, left shoulder: Secondary | ICD-10-CM | POA: Diagnosis not present

## 2023-10-22 DIAGNOSIS — M25512 Pain in left shoulder: Secondary | ICD-10-CM | POA: Insufficient documentation

## 2023-10-22 DIAGNOSIS — M67814 Other specified disorders of tendon, left shoulder: Secondary | ICD-10-CM | POA: Diagnosis not present

## 2023-10-22 DIAGNOSIS — M75112 Incomplete rotator cuff tear or rupture of left shoulder, not specified as traumatic: Secondary | ICD-10-CM | POA: Diagnosis not present

## 2023-10-28 DIAGNOSIS — M25512 Pain in left shoulder: Secondary | ICD-10-CM | POA: Diagnosis not present

## 2023-11-19 DIAGNOSIS — M25512 Pain in left shoulder: Secondary | ICD-10-CM | POA: Diagnosis not present

## 2023-11-21 ENCOUNTER — Other Ambulatory Visit: Payer: Self-pay

## 2023-12-03 ENCOUNTER — Other Ambulatory Visit (HOSPITAL_COMMUNITY): Payer: Self-pay

## 2023-12-03 DIAGNOSIS — I10 Essential (primary) hypertension: Secondary | ICD-10-CM | POA: Diagnosis not present

## 2023-12-03 DIAGNOSIS — G47411 Narcolepsy with cataplexy: Secondary | ICD-10-CM | POA: Diagnosis not present

## 2023-12-03 MED ORDER — MODAFINIL 100 MG PO TABS
50.0000 mg | ORAL_TABLET | Freq: Every morning | ORAL | 0 refills | Status: AC
  Filled 2023-12-03 – 2023-12-11 (×3): qty 30, 60d supply, fill #0

## 2023-12-04 ENCOUNTER — Other Ambulatory Visit (HOSPITAL_COMMUNITY): Payer: Self-pay

## 2023-12-11 ENCOUNTER — Other Ambulatory Visit (HOSPITAL_COMMUNITY): Payer: Self-pay

## 2023-12-17 DIAGNOSIS — M25512 Pain in left shoulder: Secondary | ICD-10-CM | POA: Diagnosis not present

## 2023-12-17 DIAGNOSIS — M7712 Lateral epicondylitis, left elbow: Secondary | ICD-10-CM | POA: Diagnosis not present

## 2024-02-18 ENCOUNTER — Other Ambulatory Visit: Payer: Self-pay

## 2024-02-18 ENCOUNTER — Other Ambulatory Visit (HOSPITAL_COMMUNITY): Payer: Self-pay

## 2024-02-18 MED ORDER — NITROGLYCERIN 0.4 MG/HR TD PT24
MEDICATED_PATCH | TRANSDERMAL | 0 refills | Status: AC
  Filled 2024-02-18: qty 30, 100d supply, fill #0

## 2024-02-20 ENCOUNTER — Other Ambulatory Visit (HOSPITAL_COMMUNITY): Payer: Self-pay

## 2024-02-21 ENCOUNTER — Other Ambulatory Visit (HOSPITAL_COMMUNITY): Payer: Self-pay

## 2024-02-21 ENCOUNTER — Other Ambulatory Visit: Payer: Self-pay
